# Patient Record
Sex: Male | Born: 1970
Health system: Southern US, Community
[De-identification: ages and names within clinical notes are randomized; demographics above are authoritative.]

## PROBLEM LIST (undated history)

## (undated) ENCOUNTER — Emergency Department (HOSPITAL_BASED_OUTPATIENT_CLINIC_OR_DEPARTMENT_OTHER): Admission: EM | Payer: No Typology Code available for payment source | Source: Home / Self Care

## (undated) ENCOUNTER — Emergency Department (HOSPITAL_BASED_OUTPATIENT_CLINIC_OR_DEPARTMENT_OTHER): Admission: EM | Payer: BC Managed Care – PPO | Source: Home / Self Care

## (undated) DIAGNOSIS — F32A Depression, unspecified: Secondary | ICD-10-CM

## (undated) DIAGNOSIS — G43909 Migraine, unspecified, not intractable, without status migrainosus: Secondary | ICD-10-CM

## (undated) DIAGNOSIS — F329 Major depressive disorder, single episode, unspecified: Secondary | ICD-10-CM

## (undated) DIAGNOSIS — G894 Chronic pain syndrome: Secondary | ICD-10-CM

## (undated) DIAGNOSIS — M419 Scoliosis, unspecified: Secondary | ICD-10-CM

## (undated) DIAGNOSIS — J189 Pneumonia, unspecified organism: Secondary | ICD-10-CM

## (undated) DIAGNOSIS — F419 Anxiety disorder, unspecified: Secondary | ICD-10-CM

## (undated) DIAGNOSIS — K219 Gastro-esophageal reflux disease without esophagitis: Secondary | ICD-10-CM

## (undated) DIAGNOSIS — IMO0001 Reserved for inherently not codable concepts without codable children: Secondary | ICD-10-CM

## (undated) DIAGNOSIS — M25579 Pain in unspecified ankle and joints of unspecified foot: Secondary | ICD-10-CM

## (undated) HISTORY — DX: Major depressive disorder, single episode, unspecified: F32.9

## (undated) HISTORY — PX: FOOT SURGERY: SHX648

## (undated) HISTORY — DX: Depression, unspecified: F32.A

## (undated) HISTORY — PX: ARTHROSCOPIC REPAIR ACL: SUR80

## (undated) HISTORY — DX: Pain in unspecified ankle and joints of unspecified foot: M25.579

## (undated) HISTORY — PX: ANKLE SURGERY: SHX546

---

## 2007-03-24 ENCOUNTER — Emergency Department (HOSPITAL_COMMUNITY): Admission: EM | Admit: 2007-03-24 | Discharge: 2007-03-24 | Payer: Self-pay | Admitting: Emergency Medicine

## 2008-06-04 ENCOUNTER — Emergency Department (HOSPITAL_BASED_OUTPATIENT_CLINIC_OR_DEPARTMENT_OTHER): Admission: EM | Admit: 2008-06-04 | Discharge: 2008-06-04 | Payer: Self-pay | Admitting: Emergency Medicine

## 2008-11-26 ENCOUNTER — Emergency Department (HOSPITAL_BASED_OUTPATIENT_CLINIC_OR_DEPARTMENT_OTHER): Admission: EM | Admit: 2008-11-26 | Discharge: 2008-11-26 | Payer: Self-pay | Admitting: Emergency Medicine

## 2008-11-26 ENCOUNTER — Ambulatory Visit: Payer: Self-pay | Admitting: Diagnostic Radiology

## 2008-12-13 ENCOUNTER — Emergency Department (HOSPITAL_BASED_OUTPATIENT_CLINIC_OR_DEPARTMENT_OTHER): Admission: EM | Admit: 2008-12-13 | Discharge: 2008-12-13 | Payer: Self-pay | Admitting: Emergency Medicine

## 2008-12-13 ENCOUNTER — Ambulatory Visit: Payer: Self-pay | Admitting: Diagnostic Radiology

## 2009-04-05 ENCOUNTER — Ambulatory Visit: Payer: Self-pay | Admitting: Diagnostic Radiology

## 2009-04-05 ENCOUNTER — Observation Stay (HOSPITAL_COMMUNITY): Admission: EM | Admit: 2009-04-05 | Discharge: 2009-04-06 | Payer: Self-pay | Admitting: Internal Medicine

## 2009-04-05 ENCOUNTER — Encounter: Payer: Self-pay | Admitting: Emergency Medicine

## 2009-05-13 ENCOUNTER — Emergency Department (HOSPITAL_BASED_OUTPATIENT_CLINIC_OR_DEPARTMENT_OTHER): Admission: EM | Admit: 2009-05-13 | Discharge: 2009-05-13 | Payer: Self-pay | Admitting: Emergency Medicine

## 2009-05-13 ENCOUNTER — Ambulatory Visit: Payer: Self-pay | Admitting: Diagnostic Radiology

## 2009-05-30 ENCOUNTER — Ambulatory Visit: Payer: Self-pay | Admitting: Diagnostic Radiology

## 2009-05-30 ENCOUNTER — Emergency Department (HOSPITAL_BASED_OUTPATIENT_CLINIC_OR_DEPARTMENT_OTHER): Admission: EM | Admit: 2009-05-30 | Discharge: 2009-05-30 | Payer: Self-pay | Admitting: Emergency Medicine

## 2009-08-12 ENCOUNTER — Ambulatory Visit: Payer: Self-pay | Admitting: Diagnostic Radiology

## 2009-08-12 ENCOUNTER — Emergency Department (HOSPITAL_BASED_OUTPATIENT_CLINIC_OR_DEPARTMENT_OTHER): Admission: EM | Admit: 2009-08-12 | Discharge: 2009-08-12 | Payer: Self-pay | Admitting: Emergency Medicine

## 2009-09-23 ENCOUNTER — Emergency Department (HOSPITAL_BASED_OUTPATIENT_CLINIC_OR_DEPARTMENT_OTHER): Admission: EM | Admit: 2009-09-23 | Discharge: 2009-09-23 | Payer: Self-pay | Admitting: Emergency Medicine

## 2009-12-30 ENCOUNTER — Emergency Department (HOSPITAL_BASED_OUTPATIENT_CLINIC_OR_DEPARTMENT_OTHER): Admission: EM | Admit: 2009-12-30 | Discharge: 2009-12-30 | Payer: Self-pay | Admitting: Emergency Medicine

## 2009-12-30 ENCOUNTER — Ambulatory Visit: Payer: Self-pay | Admitting: Diagnostic Radiology

## 2010-05-15 LAB — CBC
HCT: 37.2 % — ABNORMAL LOW (ref 39.0–52.0)
Hemoglobin: 13.2 g/dL (ref 13.0–17.0)
MCH: 31.8 pg (ref 26.0–34.0)
MCHC: 35.4 g/dL (ref 30.0–36.0)
RDW: 12.4 % (ref 11.5–15.5)

## 2010-05-15 LAB — DIFFERENTIAL
Lymphocytes Relative: 39 % (ref 12–46)
Lymphs Abs: 3.1 10*3/uL (ref 0.7–4.0)
Neutro Abs: 4.1 10*3/uL (ref 1.7–7.7)
Neutrophils Relative %: 50 % (ref 43–77)

## 2010-05-15 LAB — POCT CARDIAC MARKERS
CKMB, poc: 1.3 ng/mL (ref 1.0–8.0)
Myoglobin, poc: 51.4 ng/mL (ref 12–200)
Troponin i, poc: <0.05 ng/mL (ref 0.00–0.09)

## 2010-05-15 LAB — COMPREHENSIVE METABOLIC PANEL
CO2: 27 mEq/L (ref 19–32)
Calcium: 10 mg/dL (ref 8.4–10.5)
Creatinine, Ser: 0.9 mg/dL (ref 0.4–1.5)
GFR calc non Af Amer: >60 mL/min (ref >60)
Glucose, Bld: 121 mg/dL — ABNORMAL HIGH (ref 70–99)
Total Protein: 7.7 g/dL (ref 6.0–8.3)

## 2010-05-22 LAB — BASIC METABOLIC PANEL
BUN: 8 mg/dL (ref 6–23)
CO2: 28 mEq/L (ref 19–32)
Chloride: 105 mEq/L (ref 96–112)
Creatinine, Ser: 0.8 mg/dL (ref 0.4–1.5)
GFR calc Af Amer: >60 mL/min (ref >60)
Glucose, Bld: 84 mg/dL (ref 70–99)

## 2010-05-22 LAB — POCT CARDIAC MARKERS
Myoglobin, poc: 95.9 ng/mL (ref 12–200)
Troponin i, poc: <0.05 ng/mL (ref 0.00–0.09)

## 2010-05-22 LAB — CBC
MCHC: 34.4 g/dL (ref 30.0–36.0)
MCV: 93.3 fL (ref 78.0–100.0)
RBC: 3.89 MIL/uL — ABNORMAL LOW (ref 4.22–5.81)

## 2010-05-22 LAB — DIFFERENTIAL
Basophils Relative: 1 % (ref 0–1)
Eosinophils Absolute: 0.1 10*3/uL (ref 0.0–0.7)
Monocytes Absolute: 1 10*3/uL (ref 0.1–1.0)
Monocytes Relative: 11 % (ref 3–12)
Neutrophils Relative %: 48 % (ref 43–77)

## 2010-05-24 LAB — BASIC METABOLIC PANEL
CO2: 26 mEq/L (ref 19–32)
Glucose, Bld: 106 mg/dL — ABNORMAL HIGH (ref 70–99)
Potassium: 4 mEq/L (ref 3.5–5.1)
Sodium: 138 mEq/L (ref 135–145)

## 2010-05-24 LAB — CK TOTAL AND CKMB (NOT AT ARMC)
Relative Index: 0.5 (ref 0.0–2.5)
Total CK: 258 U/L — ABNORMAL HIGH (ref 7–232)
Total CK: 313 U/L — ABNORMAL HIGH (ref 7–232)

## 2010-05-24 LAB — LIPID PANEL
Cholesterol: 155 mg/dL (ref 0–200)
LDL Cholesterol: 87 mg/dL (ref 0–99)
Total CHOL/HDL Ratio: 4 RATIO

## 2010-05-24 LAB — HEMOGLOBIN A1C
Hgb A1c MFr Bld: 5.9 % (ref 4.6–6.1)
Mean Plasma Glucose: 123 mg/dL

## 2010-05-24 LAB — TSH: TSH: 1.045 u[IU]/mL (ref 0.350–4.500)

## 2010-05-24 LAB — HEPATIC FUNCTION PANEL: Total Protein: 6.7 g/dL (ref 6.0–8.3)

## 2010-05-24 LAB — CBC
MCV: 92.9 fL (ref 78.0–100.0)
RBC: 4.03 MIL/uL — ABNORMAL LOW (ref 4.22–5.81)
WBC: 6.5 10*3/uL (ref 4.0–10.5)

## 2010-05-26 ENCOUNTER — Emergency Department (HOSPITAL_BASED_OUTPATIENT_CLINIC_OR_DEPARTMENT_OTHER)
Admission: EM | Admit: 2010-05-26 | Discharge: 2010-05-26 | Disposition: A | Discharge status: Home / Self Care | Payer: Medicaid Other | Attending: Emergency Medicine | Admitting: Emergency Medicine

## 2010-05-26 DIAGNOSIS — M25579 Pain in unspecified ankle and joints of unspecified foot: Secondary | ICD-10-CM | POA: Insufficient documentation

## 2010-05-26 DIAGNOSIS — E7800 Pure hypercholesterolemia, unspecified: Secondary | ICD-10-CM | POA: Insufficient documentation

## 2010-06-06 LAB — POCT CARDIAC MARKERS
CKMB, poc: <1 ng/mL — ABNORMAL LOW (ref 1.0–8.0)
CKMB, poc: <1 ng/mL — ABNORMAL LOW (ref 1.0–8.0)
Myoglobin, poc: 61.4 ng/mL (ref 12–200)
Myoglobin, poc: 70.2 ng/mL (ref 12–200)
Troponin i, poc: <0.05 ng/mL (ref 0.00–0.09)
Troponin i, poc: <0.05 ng/mL (ref 0.00–0.09)

## 2010-06-06 LAB — CBC
HCT: 38.7 % — ABNORMAL LOW (ref 39.0–52.0)
MCHC: 35.2 g/dL (ref 30.0–36.0)
MCV: 91.2 fL (ref 78.0–100.0)
Platelets: 309 10*3/uL (ref 150–400)
RDW: 12.2 % (ref 11.5–15.5)

## 2010-06-06 LAB — BASIC METABOLIC PANEL
BUN: 11 mg/dL (ref 6–23)
CO2: 28 mEq/L (ref 19–32)
Chloride: 104 mEq/L (ref 96–112)
Glucose, Bld: 140 mg/dL — ABNORMAL HIGH (ref 70–99)
Potassium: 3.6 mEq/L (ref 3.5–5.1)

## 2010-06-07 LAB — BASIC METABOLIC PANEL
Calcium: 9.5 mg/dL (ref 8.4–10.5)
GFR calc non Af Amer: >60 mL/min (ref >60)
Potassium: 4 mEq/L (ref 3.5–5.1)
Sodium: 142 mEq/L (ref 135–145)

## 2010-06-07 LAB — CBC
HCT: 37.7 % — ABNORMAL LOW (ref 39.0–52.0)
Hemoglobin: 12.8 g/dL — ABNORMAL LOW (ref 13.0–17.0)
MCHC: 33.9 g/dL (ref 30.0–36.0)
MCV: 91.7 fL (ref 78.0–100.0)
Platelets: 287 10*3/uL (ref 150–400)
RBC: 4.11 MIL/uL — ABNORMAL LOW (ref 4.22–5.81)
RDW: 12.4 % (ref 11.5–15.5)
WBC: 7.9 10*3/uL (ref 4.0–10.5)

## 2010-06-07 LAB — POCT CARDIAC MARKERS
CKMB, poc: 1.7 ng/mL (ref 1.0–8.0)
Myoglobin, poc: 67 ng/mL (ref 12–200)
Troponin i, poc: <0.05 ng/mL (ref 0.00–0.09)

## 2010-07-17 ENCOUNTER — Emergency Department (HOSPITAL_BASED_OUTPATIENT_CLINIC_OR_DEPARTMENT_OTHER)
Admission: EM | Admit: 2010-07-17 | Discharge: 2010-07-17 | Disposition: A | Discharge status: Home / Self Care | Payer: Medicaid Other | Attending: Emergency Medicine | Admitting: Emergency Medicine

## 2010-07-17 DIAGNOSIS — G8929 Other chronic pain: Secondary | ICD-10-CM | POA: Insufficient documentation

## 2010-07-17 DIAGNOSIS — M25579 Pain in unspecified ankle and joints of unspecified foot: Secondary | ICD-10-CM | POA: Insufficient documentation

## 2010-07-17 DIAGNOSIS — E7800 Pure hypercholesterolemia, unspecified: Secondary | ICD-10-CM | POA: Insufficient documentation

## 2010-08-04 ENCOUNTER — Emergency Department (HOSPITAL_BASED_OUTPATIENT_CLINIC_OR_DEPARTMENT_OTHER)
Admission: EM | Admit: 2010-08-04 | Discharge: 2010-08-04 | Discharge status: Against Medical Advice | Payer: Medicaid Other | Attending: Emergency Medicine | Admitting: Emergency Medicine

## 2010-10-28 ENCOUNTER — Emergency Department (INDEPENDENT_AMBULATORY_CARE_PROVIDER_SITE_OTHER): Payer: Medicaid Other

## 2010-10-28 ENCOUNTER — Encounter: Payer: Self-pay | Admitting: *Deleted

## 2010-10-28 ENCOUNTER — Emergency Department (HOSPITAL_BASED_OUTPATIENT_CLINIC_OR_DEPARTMENT_OTHER)
Admission: EM | Admit: 2010-10-28 | Discharge: 2010-10-28 | Disposition: A | Discharge status: Home / Self Care | Payer: Medicaid Other | Attending: Emergency Medicine | Admitting: Emergency Medicine

## 2010-10-28 DIAGNOSIS — S93699A Other sprain of unspecified foot, initial encounter: Secondary | ICD-10-CM | POA: Insufficient documentation

## 2010-10-28 DIAGNOSIS — X58XXXA Exposure to other specified factors, initial encounter: Secondary | ICD-10-CM | POA: Insufficient documentation

## 2010-10-28 DIAGNOSIS — IMO0002 Reserved for concepts with insufficient information to code with codable children: Secondary | ICD-10-CM

## 2010-10-28 DIAGNOSIS — M79609 Pain in unspecified limb: Secondary | ICD-10-CM

## 2010-10-28 DIAGNOSIS — R609 Edema, unspecified: Secondary | ICD-10-CM

## 2010-10-28 DIAGNOSIS — S93409A Sprain of unspecified ligament of unspecified ankle, initial encounter: Secondary | ICD-10-CM

## 2010-10-28 MED ORDER — IBUPROFEN 800 MG PO TABS
ORAL_TABLET | ORAL | Status: AC
  Filled 2010-10-28: qty 1

## 2010-10-28 MED ORDER — IBUPROFEN 800 MG PO TABS: 800.0000 mg | ORAL_TABLET | Freq: Three times a day (TID) | ORAL | Status: DC

## 2010-10-28 MED ORDER — OXYCODONE-ACETAMINOPHEN 5-325 MG PO TABS: 2.0000 | ORAL_TABLET | ORAL | Status: AC | PRN

## 2010-10-28 MED ORDER — IBUPROFEN 800 MG PO TABS: 800.0000 mg | ORAL_TABLET | Freq: Three times a day (TID) | ORAL | Status: AC

## 2010-10-28 MED ORDER — OXYCODONE-ACETAMINOPHEN 5-325 MG PO TABS
1.0000 | ORAL_TABLET | Freq: Once | ORAL | Status: AC
  Administered 2010-10-28: 1 via ORAL
  Filled 2010-10-28: qty 1

## 2010-10-28 MED ORDER — IBUPROFEN 800 MG PO TABS
800.0000 mg | ORAL_TABLET | Freq: Once | ORAL | Status: AC
  Administered 2010-10-28: 800 mg via ORAL

## 2010-10-28 NOTE — ED Provider Notes (Signed)
History     CSN: [id number] Arrival date & time: 10/28/2010  1:09 AM  Chief Complaint  Patient presents with  . Foot Injury   HPI 40 year old male presents to emergency room with complaint of left foot pain. 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 Patient is status post first metatarsal joint reconstructive surgery in May. Patient was seen this past week by his surgeon from [facility], and patient was doing well postop but may need to have surgery for a bunion. Patient has been walking with a cane since the injury. He reports pain is currently excruciating and has been steadily worsening since onset. No weakness numbness. Patient is concerned he has messed up his surgery. No other injuries  History reviewed. No pertinent past medical history.  Past Surgical History  Procedure Date  . Ankle surgery   . Foot surgery     History reviewed. No pertinent family history.  History  Substance Use Topics  . Smoking status: Never Smoker   . Smokeless tobacco: Not on file  . Alcohol Use: No      Review of Systems  Constitutional: Negative.   HENT: Negative.   Eyes: Negative.   Respiratory: Negative.   Cardiovascular: Negative.   Gastrointestinal: Negative.   Genitourinary: Negative.   Musculoskeletal: Positive for arthralgias.  Neurological: Negative.   Hematological: Negative.     Physical Exam  BP 110/69  Pulse 60  Temp(Src) 99 F (37.2 C) (Oral)  Resp 18  Ht 5\' 7"  (1.702 m)  Wt 204 lb (92.534 kg)  BMI 31.95 kg/m2  SpO2 97%  Physical Exam  Constitutional: He is oriented to person, place, and time. He appears well-developed and well-nourished.       Patient is uncomfortable appearing  HENT:  Head: Normocephalic and atraumatic.  Eyes: Conjunctivae and EOM are normal. Pupils are equal, round,  and reactive to light.       Patient with horizontal nystagmus noted  Musculoskeletal: He exhibits tenderness. He exhibits no edema.       Musculoskeletal exam normal other than findings in left lower extremity.  LLE: Patient noted to have surgical scar to the first mid metatarsal. No obvious deformity. No erythema, fluctuance or crepitus. Patient reports pain with palpation of first metatarsal, also along plantar aspect of medial left foot, patient also with pain along his Achilles and posterior medial malleolus. Achilles tendon is intact normal range of motion at ankle and toes. Neurovascularly intact normal sensation, normal pulses, good cap refill  Neurological: He is alert and oriented to person, place, and time.  Skin: Skin is warm and dry. No rash noted. No erythema. No pallor.    ED Course  Procedures  Dg Foot Complete Left  10/28/2010  *RADIOLOGY REPORT*  Clinical Data: Left foot pain and swelling in the region of the first metatarsal.  LEFT FOOT - COMPLETE 3+ VIEW  Comparison: None.  Findings: There is 2 screw fixation across the first tarsometatarsal joint.  Associated heterotopic ossification across the joint without complete fusion.  No definite acute fracture is identified.  No dislocation.  The Lisfranc joint appears maintained.  IMPRESSION: Postsurgical changes across the first tarsometatarsal joint without definite acute fracture identified.If clinical concern for a fracture persists, recommend a repeat radiograph in 3-5 days to evaluate for interval change or callus  formation.  Original Report Authenticated By: Waneta Martins, M.D.    MDM X-rays without fracture or distortion of prior surgical screws. Will place patient in air cast, and encourage him to use his crutches and avoid weightbearing on that foot. Patient is to follow up with his foot surgeon within the week. I will prescribe short course of pain medicine for acute injury      Olivia Mackie, MD 10/28/10 [phone]

## 2010-10-28 NOTE — ED Notes (Signed)
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 Now c/o left foot pain. Hx surgery to same in May of this year.

## 2010-10-28 NOTE — ED Notes (Signed)
Pt transported to xray via wheel chair

## 2011-03-06 ENCOUNTER — Emergency Department (HOSPITAL_BASED_OUTPATIENT_CLINIC_OR_DEPARTMENT_OTHER)
Admission: EM | Admit: 2011-03-06 | Discharge: 2011-03-06 | Disposition: A | Discharge status: Home / Self Care | Payer: Self-pay | Attending: Emergency Medicine | Admitting: Emergency Medicine

## 2011-03-06 ENCOUNTER — Encounter (HOSPITAL_BASED_OUTPATIENT_CLINIC_OR_DEPARTMENT_OTHER): Payer: Self-pay | Admitting: Emergency Medicine

## 2011-03-06 DIAGNOSIS — M79673 Pain in unspecified foot: Secondary | ICD-10-CM

## 2011-03-06 DIAGNOSIS — M25579 Pain in unspecified ankle and joints of unspecified foot: Secondary | ICD-10-CM | POA: Insufficient documentation

## 2011-03-06 MED ORDER — OXYCODONE-ACETAMINOPHEN 5-325 MG PO TABS: 1.0000 | ORAL_TABLET | Freq: Four times a day (QID) | ORAL | Status: AC | PRN

## 2011-03-06 MED ORDER — OXYCODONE-ACETAMINOPHEN 5-325 MG PO TABS
1.0000 | ORAL_TABLET | Freq: Once | ORAL | Status: AC
  Administered 2011-03-06: 1 via ORAL
  Filled 2011-03-06: qty 1

## 2011-03-06 NOTE — ED Provider Notes (Addendum)
History     CSN: [id number]  Arrival date & time 03/06/11  0100   None     Chief Complaint  Patient presents with  . Foot Pain    (Consider location/radiation/quality/duration/timing/severity/associated sxs/prior treatment) Patient is a 41 y.o. male presenting with lower extremity pain. The history is provided by the patient.  Foot Pain  pt c/o left foot pain. States has had 3 surgeries on foot, last of which was 10/12. States had healed well, but felt 'overdid' it 2 nights ago. C/o pain to mid foot. No injury, twisting or fall. No redness/swelling. No numbness. Constant dull pain, worse w palpation. Has been ambulatory.   History reviewed. No pertinent past medical history.  Past Surgical History  Procedure Date  . Ankle surgery   . Foot surgery     No family history on file.  History  Substance Use Topics  . Smoking status: Never Smoker   . Smokeless tobacco: Not on file  . Alcohol Use: No      Review of Systems  Constitutional: Negative for fever.  Skin: Negative for wound.  Neurological: Negative for numbness.    Allergies  Review of patient's allergies indicates no known allergies.  Home Medications  No current outpatient prescriptions on file.  BP 139/81  Pulse 77  Temp(Src) 98.9 F (37.2 C) (Oral)  Resp 18  SpO2 98%  Physical Exam  Nursing note and vitals reviewed. Constitutional: He is oriented to person, place, and time. He appears well-developed and well-nourished. No distress.  HENT:  Head: Atraumatic.  Eyes: Pupils are equal, round, and reactive to light.  Neck: Neck supple. No tracheal deviation present.  Cardiovascular: Normal rate.   Pulmonary/Chest: Effort normal. No accessory muscle usage. No respiratory distress.  Musculoskeletal: Normal range of motion.       Healed surgical scar to dorsum left foot without sign of infection. Skin intact. No sts or focal bony tenderness. Mild diffuse tenderness to midfoot. Distal pulses palp. No  erythema, or inc warmth to foot.   Neurological: He is alert and oriented to person, place, and time.  Skin: Skin is warm and dry.  Psychiatric: He has a normal mood and affect.    ED Course  Procedures (including critical care time)     MDM  Pt states inadequate pain relief w motrin. Will give small quantity pain rx, encouraged follow up with his doctors. Pt has ride, does not have to drive.         Suzi Roots, MD 03/06/11 1308  Suzi Roots, MD 03/07/11 [phone]

## 2011-03-06 NOTE — ED Notes (Signed)
Pt c/o left foot pain. Pt reports having bunion and reconstructive surgery on left foot 10/27. No new injury known

## 2011-04-07 ENCOUNTER — Other Ambulatory Visit: Payer: Self-pay

## 2011-04-07 ENCOUNTER — Encounter (HOSPITAL_BASED_OUTPATIENT_CLINIC_OR_DEPARTMENT_OTHER): Payer: Self-pay | Admitting: *Deleted

## 2011-04-07 DIAGNOSIS — F341 Dysthymic disorder: Secondary | ICD-10-CM | POA: Insufficient documentation

## 2011-04-07 DIAGNOSIS — F410 Panic disorder [episodic paroxysmal anxiety] without agoraphobia: Secondary | ICD-10-CM | POA: Insufficient documentation

## 2011-04-07 NOTE — ED Notes (Signed)
Pt c/o increased stress and anxiety for few days, now c/o numbness in fingertips and toes, heart racing, and shortness of breath. Pt states that he feels like he is having a anxiety attack.

## 2011-04-08 ENCOUNTER — Emergency Department (HOSPITAL_BASED_OUTPATIENT_CLINIC_OR_DEPARTMENT_OTHER)
Admission: EM | Admit: 2011-04-08 | Discharge: 2011-04-08 | Disposition: A | Discharge status: Home / Self Care | Payer: Medicaid Other | Attending: Emergency Medicine | Admitting: Emergency Medicine

## 2011-04-08 DIAGNOSIS — F410 Panic disorder [episodic paroxysmal anxiety] without agoraphobia: Secondary | ICD-10-CM

## 2011-04-08 HISTORY — DX: Anxiety disorder, unspecified: F41.9

## 2011-04-08 HISTORY — DX: Depression, unspecified: F32.A

## 2011-04-08 HISTORY — DX: Major depressive disorder, single episode, unspecified: F32.9

## 2011-04-08 LAB — BASIC METABOLIC PANEL
BUN: 12 mg/dL (ref 6–23)
Calcium: 9.1 mg/dL (ref 8.4–10.5)
GFR calc Af Amer: >90 mL/min (ref >90)
GFR calc non Af Amer: >90 mL/min (ref >90)
Potassium: 3.7 mEq/L (ref 3.5–5.1)
Sodium: 138 mEq/L (ref 135–145)

## 2011-04-08 LAB — CBC
Hemoglobin: 11.7 g/dL — ABNORMAL LOW (ref 13.0–17.0)
MCH: 30.2 pg (ref 26.0–34.0)
MCHC: 34.6 g/dL (ref 30.0–36.0)
Platelets: 281 10*3/uL (ref 150–400)

## 2011-04-08 LAB — DIFFERENTIAL
Basophils Absolute: 0 10*3/uL (ref 0.0–0.1)
Basophils Relative: 0 % (ref 0–1)
Eosinophils Absolute: 0.1 10*3/uL (ref 0.0–0.7)
Monocytes Relative: 10 % (ref 3–12)
Neutro Abs: 3.6 10*3/uL (ref 1.7–7.7)
Neutrophils Relative %: 46 % (ref 43–77)

## 2011-04-08 MED ORDER — SODIUM CHLORIDE 0.9 % IV SOLN
Freq: Once | INTRAVENOUS | Status: AC
  Administered 2011-04-08: 02:00:00 via INTRAVENOUS

## 2011-04-08 MED ORDER — LORAZEPAM 2 MG/ML IJ SOLN
1.0000 mg | Freq: Once | INTRAMUSCULAR | Status: AC
  Administered 2011-04-08: 1 mg via INTRAVENOUS
  Filled 2011-04-08: qty 1

## 2011-04-08 NOTE — ED Notes (Signed)
Pt reports of continue to feel numbness with tingling in upper and lower ext

## 2011-04-08 NOTE — ED Provider Notes (Signed)
History     CSN: [id number]  Arrival date & time 04/07/11  2317   First MD Initiated Contact with Patient 04/08/11 0153      Chief Complaint  Patient presents with  . Panic Attack    (Consider location/radiation/quality/duration/timing/severity/associated sxs/prior treatment) HPI This is a 41 year old male complains the onset of chest tightness at 9:30 PM yesterday. The symptoms are mild to moderate to severe at times. The discomfort has been constant but varies as noted. He is also having paresthesias in his arms and legs and numbness in his fingertips and toes. He is not having perioral paresthesias. He had dyspnea earlier but not at present time. He has had intermittent nausea. He had a similar episode 3 weeks ago and was diagnosed as having a panic attack. He was placed on antidepressant as well as lorazepam. He was not at home and so did not have any lorazepam to take this evening.  Past Medical History  Diagnosis Date  . Anxiety and depression     Past Surgical History  Procedure Date  . Ankle surgery   . Foot surgery     History reviewed. No pertinent family history.  History  Substance Use Topics  . Smoking status: Never Smoker   . Smokeless tobacco: Not on file  . Alcohol Use: No      Review of Systems  All other systems reviewed and are negative.    Allergies  Review of patient's allergies indicates no known allergies.  Home Medications   Current Outpatient Rx  Name Route Sig Dispense Refill  . LORAZEPAM 1 MG PO TABS Oral Take 1 mg by mouth every 8 (eight) hours.    . OXYCODONE HCL ER 15 MG PO TB12 Oral Take 15 mg by mouth every 12 (twelve) hours.      BP 147/78  Pulse 91  Temp(Src) 98 F (36.7 C) (Oral)  Resp 19  Ht 5\' 7"  (1.702 m)  Wt 200 lb (90.719 kg)  BMI 31.32 kg/m2  SpO2 97%  Physical Exam General: Well-developed, well-nourished male in no acute distress; appearance consistent with age of record; comfortable-appearing HENT:  normocephalic, atraumatic Eyes: pupils equal round and reactive to light; extraocular muscles intact Neck: supple Heart: regular rate and rhythm Lungs: clear to auscultation bilaterally Abdomen: soft; nondistended; nontender; no masses or hepatosplenomegaly; bowel sounds present Extremities: No deformity; full range of motion; pulses normal Neurologic: Awake, alert and oriented; motor function intact in all extremities and symmetric; no facial droop Skin: Warm and dry Psychiatric: Flat affect    ED Course  Procedures (including critical care time)     MDM  EKG Interpretation:  Date & Time: 04/08/2011 1:54 AM  Rate: 67  Rhythm: normal sinus rhythm  QRS Axis: normal  Intervals: normal  ST/T Wave abnormalities: Inverted T waves inferiorly and laterally  Conduction Disutrbances:none  Narrative Interpretation:   Old EKG Reviewed: unchanged  Nursing notes and vitals signs, including pulse oximetry, reviewed.  Summary of this visit's results, reviewed by myself:  Labs:  Results for orders placed during the hospital encounter of 04/08/11  TROPONIN I      Component Value Range   Troponin I <0.30  <0.30 (ng/mL)  BASIC METABOLIC PANEL      Component Value Range   Sodium 138  135 - 145 (mEq/L)   Potassium 3.7  3.5 - 5.1 (mEq/L)   Chloride 105  96 - 112 (mEq/L)   CO2 26  19 - 32 (mEq/L)   Glucose, Bld  118 (*) 70 - 99 (mg/dL)   BUN 12  6 - 23 (mg/dL)   Creatinine, Ser 7.82  0.50 - 1.35 (mg/dL)   Calcium 9.1  8.4 - 95.6 (mg/dL)   GFR calc non Af Amer >90  >90 (mL/min)   GFR calc Af Amer >90  >90 (mL/min)  CBC      Component Value Range   WBC 7.9  4.0 - 10.5 (K/uL)   RBC 3.87 (*) 4.22 - 5.81 (MIL/uL)   Hemoglobin 11.7 (*) 13.0 - 17.0 (g/dL)   HCT 21.3 (*) 08.6 - 52.0 (%)   MCV 87.3  78.0 - 100.0 (fL)   MCH 30.2  26.0 - 34.0 (pg)   MCHC 34.6  30.0 - 36.0 (g/dL)   RDW 57.8  46.9 - 62.9 (%)   Platelets 281  150 - 400 (K/uL)  DIFFERENTIAL      Component Value Range    Neutrophils Relative 46  43 - 77 (%)   Neutro Abs 3.6  1.7 - 7.7 (K/uL)   Lymphocytes Relative 42  12 - 46 (%)   Lymphs Abs 3.3  0.7 - 4.0 (K/uL)   Monocytes Relative 10  3 - 12 (%)   Monocytes Absolute 0.8  0.1 - 1.0 (K/uL)   Eosinophils Relative 2  0 - 5 (%)   Eosinophils Absolute 0.1  0.0 - 0.7 (K/uL)   Basophils Relative 0  0 - 1 (%)   Basophils Absolute 0.0  0.0 - 0.1 (K/uL)   3:22 AM Asymptomatic after IV Ativan. Patient will follow up with Dr. Julio Sicks. He was advised to return for worsening symptoms.             Hanley Seamen, MD 04/08/11 [phone]

## 2011-06-03 ENCOUNTER — Emergency Department (HOSPITAL_BASED_OUTPATIENT_CLINIC_OR_DEPARTMENT_OTHER)
Admission: EM | Admit: 2011-06-03 | Discharge: 2011-06-04 | Disposition: A | Discharge status: Home / Self Care | Payer: Medicaid Other | Attending: Emergency Medicine | Admitting: Emergency Medicine

## 2011-06-03 ENCOUNTER — Encounter (HOSPITAL_BASED_OUTPATIENT_CLINIC_OR_DEPARTMENT_OTHER): Payer: Self-pay | Admitting: *Deleted

## 2011-06-03 DIAGNOSIS — F3289 Other specified depressive episodes: Secondary | ICD-10-CM | POA: Insufficient documentation

## 2011-06-03 DIAGNOSIS — F411 Generalized anxiety disorder: Secondary | ICD-10-CM | POA: Insufficient documentation

## 2011-06-03 DIAGNOSIS — G8918 Other acute postprocedural pain: Secondary | ICD-10-CM

## 2011-06-03 DIAGNOSIS — Z79899 Other long term (current) drug therapy: Secondary | ICD-10-CM | POA: Insufficient documentation

## 2011-06-03 DIAGNOSIS — F329 Major depressive disorder, single episode, unspecified: Secondary | ICD-10-CM | POA: Insufficient documentation

## 2011-06-03 MED ORDER — HYDROMORPHONE HCL PF 2 MG/ML IJ SOLN
2.0000 mg | Freq: Once | INTRAMUSCULAR | Status: AC
  Administered 2011-06-04: 2 mg via INTRAMUSCULAR
  Filled 2011-06-03: qty 1

## 2011-06-03 NOTE — ED Notes (Signed)
Pt. Reports he has relafin and oxycodone for pain and he reports it is not working.  Pt. Reports he did not call his Dr. About the pain.

## 2011-06-03 NOTE — ED Notes (Signed)
Surgery 06/02/2011 by dr Janett Billow shared at [facility] , implant placed to keep foot from rotating pt states pain in unbearable, oxycodone not helping pain, cap refil <3 sec, able to move toes

## 2011-06-03 NOTE — ED Notes (Signed)
Pt complaining of pain to foot, last time patient took pain medication was at 2130, he took percocet that was prescribed by surgeon. Pt requesting additional medication

## 2011-06-03 NOTE — Discharge Instructions (Signed)
Elevate your foot. Use ice packs for pain and swelling. Let Dr Raynald Kemp know about your ED visit in the morning.

## 2011-06-03 NOTE — ED Notes (Signed)
Dr.Knapp at bedside  

## 2011-06-04 NOTE — ED Provider Notes (Signed)
History     CSN: [id number]  Arrival date & time 06/03/11  2209   First MD Initiated Contact with Patient 06/03/11 2256      Chief Complaint  Patient presents with  . Foot Pain    Pt. reports he had R foot surgery on yesterday at [facility].  Dr. Raynald Kemp performed the surgery per Pt.    (Consider location/radiation/quality/duration/timing/severity/associated sxs/prior treatment) HPI  Patient relates he has very flat feet and he's had 3 surgeries on each one of his feet. His most recent surgery was yesterday and he had implant inserted on his right foot. This was done by Dr. Raynald Kemp at the [facility]. He relates he was doing fine but tonight his pain got more intense and his oral pain medicines are not helping. He denies any fever. He denies any injury since his surgery. He states the pain is in his whole foot.  PCP Dr. Julio Sicks Podiatrist dr Raynald Kemp  Past Medical History  Diagnosis Date  . Anxiety and depression   nystagymus  Past Surgical History  Procedure Date  . Ankle surgery   . Foot surgery     No family history on file.  History  Substance Use Topics  . Smoking status: Never Smoker   . Smokeless tobacco: Not on file  . Alcohol Use: No  disability   Review of Systems  All other systems reviewed and are negative.    Allergies  Review of patient's allergies indicates no known allergies.  Home Medications   Current Outpatient Rx  Name Route Sig Dispense Refill  . LORAZEPAM 1 MG PO TABS Oral Take 2-3 mg by mouth 2 (two) times daily between meals as needed. 2 during the day if needed and 3 at night for anxiety    . MIRTAZAPINE 15 MG PO TABS Oral Take 7.5 mg by mouth at bedtime.    Marland Kitchen NABUMETONE 500 MG PO TABS Oral Take 500 mg by mouth daily.    Marland Kitchen OVER THE COUNTER MEDICATION  [employer] 1 scoop daily    . OXYCODONE HCL 15 MG PO TABS Oral Take 15 mg by mouth every 4 (four) hours as needed. For pain      BP 111/85  Pulse 78   Temp 98.3 F (36.8 C)  Resp 18  Ht 5\' 7"  (1.702 m)  Wt 200 lb (90.719 kg)  BMI 31.32 kg/m2  SpO2 98%  Vital signs normal    Physical Exam  Constitutional: He is oriented to person, place, and time. He appears well-developed and well-nourished.  Non-toxic appearance. He does not appear ill. No distress.  HENT:  Head: Normocephalic and atraumatic.  Right Ear: External ear normal.  Left Ear: External ear normal.  Nose: Nose normal. No mucosal edema or rhinorrhea.  Mouth/Throat: Oropharynx is clear and moist and mucous membranes are normal. No dental abscesses or uvula swelling.  Eyes: Conjunctivae and EOM are normal. Pupils are equal, round, and reactive to light.       Patient noted to have horizontal nystagymus  Neck: Normal range of motion and full passive range of motion without pain. Neck supple.  Pulmonary/Chest: Effort normal. No respiratory distress. He has no rhonchi. He exhibits no crepitus.  Abdominal: Normal appearance.  Musculoskeletal: Normal range of motion. He exhibits edema. He exhibits no tenderness.       Patient has a small surgical incision on the lateral dorsal aspect of his right foot with mild swelling surrounding it. The dressing only had  a small dot of dried blood on it. He has intact pulses. His foot and toes are warm without change in color.  Neurological: He is alert and oriented to person, place, and time. He has normal strength. No cranial nerve deficit.  Skin: Skin is warm, dry and intact. No rash noted. No erythema. No pallor.  Psychiatric: He has a normal mood and affect. His speech is normal and behavior is normal. His mood appears not anxious.    ED Course  Procedures (including critical care time)   Medications  HYDROmorphone (DILAUDID) injection 2 mg (2 mg Intramuscular Given 06/04/11 0008)   Dressing reapplied and pt given an ice pack to use.      1. Post-operative pain     Plan discharge  Devoria Albe, MD, FACEP   MDM           Ward Givens, MD 06/04/11 [phone]

## 2011-10-12 ENCOUNTER — Encounter (HOSPITAL_BASED_OUTPATIENT_CLINIC_OR_DEPARTMENT_OTHER): Payer: Self-pay | Admitting: *Deleted

## 2011-10-12 ENCOUNTER — Emergency Department (HOSPITAL_BASED_OUTPATIENT_CLINIC_OR_DEPARTMENT_OTHER): Payer: Medicaid Other

## 2011-10-12 ENCOUNTER — Emergency Department (HOSPITAL_BASED_OUTPATIENT_CLINIC_OR_DEPARTMENT_OTHER)
Admission: EM | Admit: 2011-10-12 | Discharge: 2011-10-12 | Disposition: A | Discharge status: Home / Self Care | Payer: Medicaid Other | Attending: Emergency Medicine | Admitting: Emergency Medicine

## 2011-10-12 DIAGNOSIS — R0789 Other chest pain: Secondary | ICD-10-CM | POA: Insufficient documentation

## 2011-10-12 DIAGNOSIS — K219 Gastro-esophageal reflux disease without esophagitis: Secondary | ICD-10-CM | POA: Insufficient documentation

## 2011-10-12 DIAGNOSIS — T59811A Toxic effect of smoke, accidental (unintentional), initial encounter: Secondary | ICD-10-CM

## 2011-10-12 DIAGNOSIS — F411 Generalized anxiety disorder: Secondary | ICD-10-CM | POA: Insufficient documentation

## 2011-10-12 DIAGNOSIS — X001XXA Exposure to smoke in uncontrolled fire in building or structure, initial encounter: Secondary | ICD-10-CM | POA: Insufficient documentation

## 2011-10-12 DIAGNOSIS — F3289 Other specified depressive episodes: Secondary | ICD-10-CM | POA: Insufficient documentation

## 2011-10-12 DIAGNOSIS — Y92009 Unspecified place in unspecified non-institutional (private) residence as the place of occurrence of the external cause: Secondary | ICD-10-CM | POA: Insufficient documentation

## 2011-10-12 DIAGNOSIS — Z87891 Personal history of nicotine dependence: Secondary | ICD-10-CM | POA: Insufficient documentation

## 2011-10-12 DIAGNOSIS — F329 Major depressive disorder, single episode, unspecified: Secondary | ICD-10-CM | POA: Insufficient documentation

## 2011-10-12 DIAGNOSIS — R0602 Shortness of breath: Secondary | ICD-10-CM | POA: Insufficient documentation

## 2011-10-12 DIAGNOSIS — J705 Respiratory conditions due to smoke inhalation: Secondary | ICD-10-CM | POA: Insufficient documentation

## 2011-10-12 HISTORY — DX: Gastro-esophageal reflux disease without esophagitis: K21.9

## 2011-10-12 HISTORY — DX: Reserved for inherently not codable concepts without codable children: IMO0001

## 2011-10-12 MED ORDER — ALBUTEROL SULFATE HFA 108 (90 BASE) MCG/ACT IN AERS
2.0000 | INHALATION_SPRAY | Freq: Four times a day (QID) | RESPIRATORY_TRACT | Status: DC | PRN
  Administered 2011-10-12: 2 via RESPIRATORY_TRACT

## 2011-10-12 MED ORDER — OXYCODONE-ACETAMINOPHEN 5-325 MG PO TABS
1.0000 | ORAL_TABLET | Freq: Once | ORAL | Status: AC
  Administered 2011-10-12: 1 via ORAL
  Filled 2011-10-12 (×2): qty 1

## 2011-10-12 MED ORDER — ALBUTEROL SULFATE HFA 108 (90 BASE) MCG/ACT IN AERS
INHALATION_SPRAY | RESPIRATORY_TRACT | Status: AC
  Filled 2011-10-12: qty 6.7

## 2011-10-12 MED ORDER — ALBUTEROL SULFATE HFA 108 (90 BASE) MCG/ACT IN AERS: 2.0000 | INHALATION_SPRAY | RESPIRATORY_TRACT | Status: DC
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
## 2012-01-13 ENCOUNTER — Emergency Department (HOSPITAL_BASED_OUTPATIENT_CLINIC_OR_DEPARTMENT_OTHER)
Admission: EM | Admit: 2012-01-13 | Discharge: 2012-01-13 | Disposition: A | Discharge status: Home / Self Care | Payer: Medicaid Other | Attending: Emergency Medicine | Admitting: Emergency Medicine

## 2012-01-13 ENCOUNTER — Encounter (HOSPITAL_BASED_OUTPATIENT_CLINIC_OR_DEPARTMENT_OTHER): Payer: Self-pay | Admitting: *Deleted

## 2012-01-13 DIAGNOSIS — IMO0002 Reserved for concepts with insufficient information to code with codable children: Secondary | ICD-10-CM | POA: Insufficient documentation

## 2012-01-13 DIAGNOSIS — Y929 Unspecified place or not applicable: Secondary | ICD-10-CM | POA: Insufficient documentation

## 2012-01-13 DIAGNOSIS — Y9389 Activity, other specified: Secondary | ICD-10-CM | POA: Insufficient documentation

## 2012-01-13 DIAGNOSIS — Z87891 Personal history of nicotine dependence: Secondary | ICD-10-CM | POA: Insufficient documentation

## 2012-01-13 DIAGNOSIS — M4120 Other idiopathic scoliosis, site unspecified: Secondary | ICD-10-CM | POA: Insufficient documentation

## 2012-01-13 DIAGNOSIS — F3289 Other specified depressive episodes: Secondary | ICD-10-CM | POA: Insufficient documentation

## 2012-01-13 DIAGNOSIS — Z8719 Personal history of other diseases of the digestive system: Secondary | ICD-10-CM | POA: Insufficient documentation

## 2012-01-13 DIAGNOSIS — Z79899 Other long term (current) drug therapy: Secondary | ICD-10-CM | POA: Insufficient documentation

## 2012-01-13 DIAGNOSIS — X58XXXA Exposure to other specified factors, initial encounter: Secondary | ICD-10-CM | POA: Insufficient documentation

## 2012-01-13 DIAGNOSIS — F329 Major depressive disorder, single episode, unspecified: Secondary | ICD-10-CM | POA: Insufficient documentation

## 2012-01-13 DIAGNOSIS — F411 Generalized anxiety disorder: Secondary | ICD-10-CM | POA: Insufficient documentation

## 2012-01-13 DIAGNOSIS — T148XXA Other injury of unspecified body region, initial encounter: Secondary | ICD-10-CM

## 2012-01-13 MED ORDER — HYDROCODONE-ACETAMINOPHEN 5-325 MG PO TABS: 2.0000 | ORAL_TABLET | ORAL | Status: DC | PRN

## 2012-01-13 MED ORDER — CYCLOBENZAPRINE HCL 10 MG PO TABS: 10.0000 mg | ORAL_TABLET | Freq: Two times a day (BID) | ORAL | Status: DC | PRN

## 2012-01-13 NOTE — ED Provider Notes (Signed)
Medical screening examination/treatment/procedure(s) were performed by non-physician practitioner and as supervising physician I was immediately available for consultation/collaboration.    Nelia Shi, MD 01/13/12 1945

## 2012-01-13 NOTE — ED Notes (Signed)
Pain across his shoulders x 3 weeks. Thinks he may have pulled a muscle.

## 2012-01-13 NOTE — ED Provider Notes (Signed)
History     CSN: [id number]  Arrival date & time 01/13/12  1230   First MD Initiated Contact with Patient 01/13/12 1318      Chief Complaint  Patient presents with  . 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 male presenting with back pain. The history is provided by the patient. No language interpreter was used.  Back Pain  This is a new problem. The current episode started more than 1 week ago. The problem occurs daily. The problem has not changed since onset.Associated with: movement. The pain is present in the thoracic spine. The quality of the pain is described as aching. The pain does not radiate. The pain is moderate. The pain is the same all the time. He has tried NSAIDs for the symptoms. The treatment provided no relief.    Past Medical History  Diagnosis Date  . Anxiety and depression   . Reflux     Past Surgical History  Procedure Date  . Ankle surgery   . Foot surgery     No family history on file.  History  Substance Use Topics  . Smoking status: Former Games developer  . Smokeless tobacco: Not on file  . Alcohol Use: No      Review of Systems  Respiratory: Negative.   Cardiovascular: Negative.   Musculoskeletal: Positive for back pain.    Allergies  Review of patient's allergies indicates no known allergies.  Home Medications   Current Outpatient Rx  Name  Route  Sig  Dispense  Refill  . CYCLOBENZAPRINE HCL 10 MG PO TABS   Oral   Take 1 tablet (10 mg total) by mouth 2 (two) times daily as needed for muscle spasms.   20 tablet   0   . HYDROCODONE-ACETAMINOPHEN 5-325 MG PO TABS   Oral   Take 2 tablets  by mouth every 4 (four) hours as needed for pain.   15 tablet   0   . LORAZEPAM 1 MG PO TABS   Oral   Take 2-3 mg by mouth 2 (two) times daily between meals as needed. 2 during the day if needed and 3 at night for anxiety         . MIRTAZAPINE 15 MG PO TABS   Oral   Take 7.5 mg by mouth at bedtime.         Marland Kitchen NABUMETONE 500 MG PO TABS   Oral   Take 500 mg by mouth daily.         Marland Kitchen OVER THE COUNTER MEDICATION      [employer] 1 scoop daily         . OXYCODONE HCL 15 MG PO TABS   Oral   Take 15 mg by mouth every 4 (four) hours as needed. For pain           BP 133/90  Pulse 79  Temp 98 F (36.7 C) (Oral)  Resp 20  Ht 5\' 7"  (1.702 m)  Wt 218 lb (98.884 kg)  BMI 34.14 kg/m2  SpO2 97%  Physical Exam  Nursing note and vitals reviewed. Constitutional: He is oriented to person, place, and time. He appears well-developed and well-nourished.  Cardiovascular: Normal rate.   Pulmonary/Chest: Breath sounds normal.  Musculoskeletal:       Thoracic paraspinal tenderness:grib strength equal  Neurological: He is alert and oriented to person, place, and time. Coordination normal.  Skin: Skin is warm and dry.  Psychiatric: He has a normal mood and affect.    ED Course  Procedures (including critical care time)  Labs Reviewed - No data to display No results found.   1. Muscle strain       MDM  Pt not having any deficits:pt has history of scoliosis:likely musculoskeletal:will treat and pt is okay to follow up with Dr. Pearletha Forge if continued symptoms after symptomatic treatment        Teressa Lower, NP 01/13/12 1429

## 2012-01-22 ENCOUNTER — Emergency Department (HOSPITAL_BASED_OUTPATIENT_CLINIC_OR_DEPARTMENT_OTHER): Payer: Medicaid Other

## 2012-01-22 ENCOUNTER — Encounter (HOSPITAL_BASED_OUTPATIENT_CLINIC_OR_DEPARTMENT_OTHER): Payer: Self-pay | Admitting: *Deleted

## 2012-01-22 ENCOUNTER — Emergency Department (HOSPITAL_BASED_OUTPATIENT_CLINIC_OR_DEPARTMENT_OTHER)
Admission: EM | Admit: 2012-01-22 | Discharge: 2012-01-22 | Disposition: A | Discharge status: Home / Self Care | Payer: Medicaid Other | Attending: Emergency Medicine | Admitting: Emergency Medicine

## 2012-01-22 DIAGNOSIS — F329 Major depressive disorder, single episode, unspecified: Secondary | ICD-10-CM | POA: Insufficient documentation

## 2012-01-22 DIAGNOSIS — J029 Acute pharyngitis, unspecified: Secondary | ICD-10-CM

## 2012-01-22 DIAGNOSIS — Z87891 Personal history of nicotine dependence: Secondary | ICD-10-CM | POA: Insufficient documentation

## 2012-01-22 DIAGNOSIS — F3289 Other specified depressive episodes: Secondary | ICD-10-CM | POA: Insufficient documentation

## 2012-01-22 DIAGNOSIS — Z79899 Other long term (current) drug therapy: Secondary | ICD-10-CM | POA: Insufficient documentation

## 2012-01-22 DIAGNOSIS — R05 Cough: Secondary | ICD-10-CM | POA: Insufficient documentation

## 2012-01-22 DIAGNOSIS — K219 Gastro-esophageal reflux disease without esophagitis: Secondary | ICD-10-CM | POA: Insufficient documentation

## 2012-01-22 DIAGNOSIS — F411 Generalized anxiety disorder: Secondary | ICD-10-CM | POA: Insufficient documentation

## 2012-01-22 DIAGNOSIS — H1130 Conjunctival hemorrhage, unspecified eye: Secondary | ICD-10-CM | POA: Insufficient documentation

## 2012-01-22 DIAGNOSIS — H5789 Other specified disorders of eye and adnexa: Secondary | ICD-10-CM | POA: Insufficient documentation

## 2012-01-22 DIAGNOSIS — R059 Cough, unspecified: Secondary | ICD-10-CM

## 2012-01-22 MED ORDER — HYDROCODONE-ACETAMINOPHEN 5-325 MG PO TABS: 2.0000 | ORAL_TABLET | ORAL | Status: DC | PRN

## 2012-01-22 MED ORDER — HYDROMORPHONE HCL PF 2 MG/ML IJ SOLN
2.0000 mg | Freq: Once | INTRAMUSCULAR | Status: AC
  Administered 2012-01-22: 2 mg via INTRAMUSCULAR
  Filled 2012-01-22: qty 1

## 2012-01-22 MED ORDER — OXYCODONE-ACETAMINOPHEN 5-325 MG PO TABS: 2.0000 | ORAL_TABLET | ORAL | Status: DC | PRN

## 2012-01-22 MED ORDER — ONDANSETRON HCL 4 MG/2ML IJ SOLN
4.0000 mg | Freq: Once | INTRAMUSCULAR | Status: DC
  Filled 2012-01-22: qty 2

## 2012-01-22 MED ORDER — ONDANSETRON HCL 4 MG/2ML IJ SOLN
4.0000 mg | Freq: Once | INTRAMUSCULAR | Status: AC
  Administered 2012-01-22: 4 mg via INTRAMUSCULAR

## 2012-01-22 NOTE — ED Notes (Signed)
Had surgery on his left foot today to have a stent removed. Here for c.o blood shot right eye and sob. Does not appear to be in resp distress.

## 2012-01-22 NOTE — ED Provider Notes (Signed)
History     CSN: [id number]  Arrival date & time 01/22/12  1735   None     Chief Complaint  Patient presents with  . Shortness of Breath    (Consider location/radiation/quality/duration/timing/severity/associated sxs/prior treatment) Patient is a 41 y.o. male presenting with eye problem. The history is provided by the patient. No language interpreter was used.  Eye Problem  This is a new problem. The problem occurs constantly. The problem has been gradually worsening. The right eye is affected.Injury mechanism: Pt had surgery today. The pain is at a severity of 3/10. The pain is moderate. There is no history of trauma to the eye. There is no known exposure to pink eye. Associated symptoms include blurred vision and eye redness. He has tried nothing for the symptoms.  Pt reports he had foot surgery today.   Pt reports his throat feels irritated and feels like something is stuck in it.  Pt reports he also has had a cough since surgery.  Pt reports eye is red   Past Medical History  Diagnosis Date  . Anxiety and depression   . Reflux     Past Surgical History  Procedure Date  . Ankle surgery   . Foot surgery     No family history on file.  History  Substance Use Topics  . Smoking status: Former Games developer  . Smokeless tobacco: Not on file  . Alcohol Use: No      Review of Systems  Eyes: Positive for blurred vision and redness.  Respiratory: Positive for cough.   All other systems reviewed and are negative.    Allergies  Review of patient's allergies indicates no known allergies.  Home Medications   Current Outpatient Rx  Name  Route  Sig  Dispense  Refill  . ACETAMINOPHEN-CODEINE #3 300-30 MG PO TABS   Oral   Take 1 tablet by mouth every 4 (four) hours as needed.         Marland Kitchen HYDROXYZINE HCL 25 MG PO TABS   Oral   Take 25 mg by mouth 3 (three) times daily as needed.         Marland Kitchen NABUMETONE 500 MG PO TABS   Oral   Take 500 mg by mouth 2 (two) times daily.        . TRAZODONE HCL 50 MG PO TABS   Oral   Take 50 mg by mouth at bedtime.         . CYCLOBENZAPRINE HCL 10 MG PO TABS   Oral   Take 1 tablet (10 mg total) by mouth 2 (two) times daily as needed for muscle spasms.   20 tablet   0   . HYDROCODONE-ACETAMINOPHEN 5-325 MG PO TABS   Oral   Take 2 tablets by mouth every 4 (four) hours as needed for pain.   15 tablet   0   . LORAZEPAM 1 MG PO TABS   Oral   Take 2-3 mg by mouth 2 (two) times daily between meals as needed. 2 during the day if needed and 3 at night for anxiety         . MIRTAZAPINE 15 MG PO TABS   Oral   Take 7.5 mg by mouth at bedtime.         Marland Kitchen NABUMETONE 500 MG PO TABS   Oral   Take 500 mg by mouth daily.         Marland Kitchen OVER THE COUNTER MEDICATION      [employer]  superfood 1 scoop daily         . OXYCODONE HCL 15 MG PO TABS   Oral   Take 15 mg by mouth every 4 (four) hours as needed. For pain           BP 123/74  Pulse 92  Temp 98.8 F (37.1 C) (Oral)  Resp 20  SpO2 98%  Physical Exam  Vitals reviewed. Constitutional: He is oriented to person, place, and time. He appears well-developed and well-nourished.  HENT:  Head: Normocephalic and atraumatic.  Right Ear: External ear normal.  Left Ear: External ear normal.  Nose: Nose normal.  Mouth/Throat: Oropharynx is clear and moist.  Eyes: EOM are normal.       Injected conjuntiva, subconjunctiva hemmorhage.  Neck: Normal range of motion. Neck supple.  Cardiovascular: Normal rate and normal heart sounds.   Pulmonary/Chest: Effort normal and breath sounds normal.  Abdominal: Soft. Bowel sounds are normal.  Musculoskeletal: Normal range of motion.  Neurological: He is alert and oriented to person, place, and time. He has normal reflexes.  Skin: Skin is warm.  Psychiatric: He has a normal mood and affect.    ED Course  Procedures (including critical care time)  Labs Reviewed - No data to display No results found.   1. Subconjunctival  hemorrhage   2. Cough   3. Sore throat       MDM   Pt given rx for oxycodone.         Lonia Skinner [place], [place] 01/22/12 2129

## 2012-01-22 NOTE — ED Provider Notes (Signed)
Medical screening examination/treatment/procedure(s) were performed by non-physician practitioner and as supervising physician I was immediately available for consultation/collaboration.  Ethelda Chick, MD 01/22/12 2130

## 2012-01-22 NOTE — ED Notes (Signed)
PA at bedside.

## 2012-02-08 ENCOUNTER — Emergency Department (HOSPITAL_BASED_OUTPATIENT_CLINIC_OR_DEPARTMENT_OTHER)
Admission: EM | Admit: 2012-02-08 | Discharge: 2012-02-08 | Disposition: A | Discharge status: Home / Self Care | Payer: Medicaid Other | Attending: Emergency Medicine | Admitting: Emergency Medicine

## 2012-02-08 ENCOUNTER — Emergency Department (HOSPITAL_BASED_OUTPATIENT_CLINIC_OR_DEPARTMENT_OTHER): Payer: Medicaid Other

## 2012-02-08 ENCOUNTER — Encounter (HOSPITAL_BASED_OUTPATIENT_CLINIC_OR_DEPARTMENT_OTHER): Payer: Self-pay | Admitting: *Deleted

## 2012-02-08 DIAGNOSIS — Z8719 Personal history of other diseases of the digestive system: Secondary | ICD-10-CM | POA: Insufficient documentation

## 2012-02-08 DIAGNOSIS — Y92009 Unspecified place in unspecified non-institutional (private) residence as the place of occurrence of the external cause: Secondary | ICD-10-CM | POA: Insufficient documentation

## 2012-02-08 DIAGNOSIS — Y9389 Activity, other specified: Secondary | ICD-10-CM | POA: Insufficient documentation

## 2012-02-08 DIAGNOSIS — S93402A Sprain of unspecified ligament of left ankle, initial encounter: Secondary | ICD-10-CM

## 2012-02-08 DIAGNOSIS — Z79899 Other long term (current) drug therapy: Secondary | ICD-10-CM | POA: Insufficient documentation

## 2012-02-08 DIAGNOSIS — X500XXA Overexertion from strenuous movement or load, initial encounter: Secondary | ICD-10-CM | POA: Insufficient documentation

## 2012-02-08 DIAGNOSIS — F341 Dysthymic disorder: Secondary | ICD-10-CM | POA: Insufficient documentation

## 2012-02-08 DIAGNOSIS — S93409A Sprain of unspecified ligament of unspecified ankle, initial encounter: Secondary | ICD-10-CM | POA: Insufficient documentation

## 2012-02-08 MED ORDER — OXYCODONE-ACETAMINOPHEN 5-325 MG PO TABS: 2.0000 | ORAL_TABLET | ORAL | Status: DC | PRN

## 2012-02-08 MED ORDER — HYDROCODONE-ACETAMINOPHEN 5-325 MG PO TABS
2.0000 | ORAL_TABLET | Freq: Once | ORAL | Status: AC
  Administered 2012-02-08: 2 via ORAL
  Filled 2012-02-08: qty 2

## 2012-02-08 NOTE — Progress Notes (Signed)
Patient crutches adjusted appropriately. Patient declining crutch teaching, expressing he is "all too familiar" with how to use them.

## 2012-02-08 NOTE — ED Notes (Signed)
Pt reports he rolled left ankle while breaking up dog fight yesterday

## 2012-02-08 NOTE — ED Provider Notes (Signed)
History     CSN: [id number]  Arrival date & time 02/08/12  1727   First MD Initiated Contact with Patient 02/08/12 1907      Chief Complaint  Patient presents with  . Ankle Pain    (Consider location/radiation/quality/duration/timing/severity/associated sxs/prior treatment) Patient is a 41 y.o. male presenting with ankle pain. The history is provided by the patient. No language interpreter was used.  Ankle Pain  The incident occurred less than 1 hour ago. The incident occurred at home. The injury mechanism was torsion. The pain is present in the left ankle. The quality of the pain is described as aching. The pain is at a severity of 6/10. The pain is moderate. The pain has been constant since onset. He reports no foreign bodies present. Nothing aggravates the symptoms. He has tried nothing for the symptoms. The treatment provided moderate relief.  Pt complains   Past Medical History  Diagnosis Date  . Anxiety and depression   . Reflux     Past Surgical History  Procedure Date  . Ankle surgery   . Foot surgery     No family history on file.  History  Substance Use Topics  . Smoking status: Former Games developer  . Smokeless tobacco: Never Used  . Alcohol Use: No      Review of Systems  Allergies  Review of patient's allergies indicates no known allergies.  Home Medications   Current Outpatient Rx  Name  Route  Sig  Dispense  Refill  . ACETAMINOPHEN-CODEINE #3 300-30 MG PO TABS   Oral   Take 1 tablet by mouth every 4 (four) hours as needed.         . CYCLOBENZAPRINE HCL 10 MG PO TABS   Oral   Take 1 tablet (10 mg total) by mouth 2 (two) times daily as needed for muscle spasms.   20 tablet   0   . HYDROXYZINE HCL 25 MG PO TABS   Oral   Take 25 mg by mouth 3 (three) times daily as needed.         Marland Kitchen MIRTAZAPINE 15 MG PO TABS   Oral   Take 7.5 mg by mouth at bedtime.         Marland Kitchen OVER THE COUNTER MEDICATION      [employer] 1 scoop daily         .  OXYCODONE HCL 15 MG PO TABS   Oral   Take 15 mg by mouth every 4 (four) hours as needed. For pain         . OXYCODONE-ACETAMINOPHEN 5-325 MG PO TABS   Oral   Take 2 tablets by mouth every 4 (four) hours as needed for pain.   16 tablet   0   . TRAZODONE HCL 50 MG PO TABS   Oral   Take 50 mg by mouth at bedtime.         Marland Kitchen HYDROCODONE-ACETAMINOPHEN 5-325 MG PO TABS   Oral   Take 2 tablets by mouth every 4 (four) hours as needed for pain.   15 tablet   0   . HYDROCODONE-ACETAMINOPHEN 5-325 MG PO TABS   Oral   Take 2 tablets by mouth every 4 (four) hours as needed for pain.   16 tablet   0   . LORAZEPAM 1 MG PO TABS   Oral   Take 2-3 mg by mouth 2 (two) times daily between meals as needed. 2 during the day if needed and 3 at  night for anxiety         . NABUMETONE 500 MG PO TABS   Oral   Take 500 mg by mouth daily.         Marland Kitchen NABUMETONE 500 MG PO TABS   Oral   Take 500 mg by mouth 2 (two) times daily.           BP 127/81  Pulse 76  Temp 98.7 F (37.1 C) (Oral)  Resp 18  SpO2 95%  Physical Exam  Nursing note and vitals reviewed. Constitutional: He is oriented to person, place, and time. He appears well-developed and well-nourished.  HENT:  Head: Normocephalic.  Musculoskeletal: He exhibits tenderness.       Swollen tender left ankle,  Healed incison  Neurological: He is alert and oriented to person, place, and time. He has normal reflexes.  Skin: Skin is warm.  Psychiatric: He has a normal mood and affect.    ED Course  Procedures (including critical care time)  Labs Reviewed - No data to display Dg Ankle Complete Left  02/08/2012  *RADIOLOGY REPORT*  Clinical Data: Ankle pain  LEFT ANKLE COMPLETE - 3+ VIEW  Comparison: None.  Findings: Three views of the left ankle submitted.  No acute fracture or subluxation.  Ankle mortise is preserved.  Metallic fixation material noted tarsal region.  IMPRESSION: No acute fracture or subluxation.   Original Report  Authenticated By: Natasha Mead, M.D.      No diagnosis found.    MDM  Pt placed in an aso and given crutches.   Pt advised to see his MD for recheck this week.       Lonia Skinner [place], [place] 02/08/12 2024

## 2012-02-08 NOTE — ED Provider Notes (Signed)
Medical screening examination/treatment/procedure(s) were performed by non-physician practitioner and as supervising physician I was immediately available for consultation/collaboration.   Ren Aspinall, MD 02/08/12 2307 

## 2012-03-09 ENCOUNTER — Emergency Department (HOSPITAL_BASED_OUTPATIENT_CLINIC_OR_DEPARTMENT_OTHER)
Admission: EM | Admit: 2012-03-09 | Discharge: 2012-03-10 | Disposition: A | Discharge status: Home / Self Care | Payer: Medicaid Other | Attending: Emergency Medicine | Admitting: Emergency Medicine

## 2012-03-09 ENCOUNTER — Emergency Department (HOSPITAL_BASED_OUTPATIENT_CLINIC_OR_DEPARTMENT_OTHER): Payer: Medicaid Other

## 2012-03-09 ENCOUNTER — Encounter (HOSPITAL_BASED_OUTPATIENT_CLINIC_OR_DEPARTMENT_OTHER): Payer: Self-pay | Admitting: *Deleted

## 2012-03-09 DIAGNOSIS — Y929 Unspecified place or not applicable: Secondary | ICD-10-CM | POA: Insufficient documentation

## 2012-03-09 DIAGNOSIS — Z79899 Other long term (current) drug therapy: Secondary | ICD-10-CM | POA: Insufficient documentation

## 2012-03-09 DIAGNOSIS — Z9889 Other specified postprocedural states: Secondary | ICD-10-CM | POA: Insufficient documentation

## 2012-03-09 DIAGNOSIS — Y9389 Activity, other specified: Secondary | ICD-10-CM | POA: Insufficient documentation

## 2012-03-09 DIAGNOSIS — S93409A Sprain of unspecified ligament of unspecified ankle, initial encounter: Secondary | ICD-10-CM

## 2012-03-09 DIAGNOSIS — F411 Generalized anxiety disorder: Secondary | ICD-10-CM | POA: Insufficient documentation

## 2012-03-09 DIAGNOSIS — W108XXA Fall (on) (from) other stairs and steps, initial encounter: Secondary | ICD-10-CM | POA: Insufficient documentation

## 2012-03-09 DIAGNOSIS — Z87891 Personal history of nicotine dependence: Secondary | ICD-10-CM | POA: Insufficient documentation

## 2012-03-09 NOTE — ED Notes (Signed)
Ankle injury. He was walking down steps and twisted his left ankle.

## 2012-03-09 NOTE — ED Notes (Signed)
Pt. Reports "rolling" his L ankle with much detail.

## 2012-03-10 MED ORDER — OXYCODONE-ACETAMINOPHEN 5-325 MG PO TABS
ORAL_TABLET | ORAL | Status: AC
  Administered 2012-03-10: 01:00:00
  Filled 2012-03-10: qty 1

## 2012-03-10 NOTE — ED Notes (Signed)
ASO applied to left ankle by Lequita Halt, EMT. Pt tolerated, +PMS post application. Pt ambulated with a steady gait.

## 2012-03-10 NOTE — ED Provider Notes (Signed)
See Epic downtime documentation.   Hanley Seamen, MD 03/10/12 [phone]

## 2012-03-10 NOTE — ED Notes (Signed)
MD at bedside. 

## 2012-04-30 ENCOUNTER — Emergency Department (HOSPITAL_BASED_OUTPATIENT_CLINIC_OR_DEPARTMENT_OTHER)
Admission: EM | Admit: 2012-04-30 | Discharge: 2012-04-30 | Disposition: A | Discharge status: Home / Self Care | Payer: Medicaid Other | Attending: Emergency Medicine | Admitting: Emergency Medicine

## 2012-04-30 ENCOUNTER — Encounter (HOSPITAL_BASED_OUTPATIENT_CLINIC_OR_DEPARTMENT_OTHER): Payer: Self-pay | Admitting: Emergency Medicine

## 2012-04-30 DIAGNOSIS — Z79899 Other long term (current) drug therapy: Secondary | ICD-10-CM | POA: Insufficient documentation

## 2012-04-30 DIAGNOSIS — Z87891 Personal history of nicotine dependence: Secondary | ICD-10-CM | POA: Insufficient documentation

## 2012-04-30 DIAGNOSIS — F341 Dysthymic disorder: Secondary | ICD-10-CM | POA: Insufficient documentation

## 2012-04-30 DIAGNOSIS — L0591 Pilonidal cyst without abscess: Secondary | ICD-10-CM

## 2012-04-30 DIAGNOSIS — Z8719 Personal history of other diseases of the digestive system: Secondary | ICD-10-CM | POA: Insufficient documentation

## 2012-04-30 MED ORDER — OXYCODONE-ACETAMINOPHEN 5-325 MG PO TABS
1.0000 | ORAL_TABLET | Freq: Once | ORAL | Status: DC
  Filled 2012-04-30 (×2): qty 1

## 2012-04-30 MED ORDER — OXYCODONE-ACETAMINOPHEN 5-325 MG PO TABS: 1.0000 | ORAL_TABLET | ORAL | Status: DC | PRN

## 2012-04-30 MED ORDER — HYDROMORPHONE HCL PF 1 MG/ML IJ SOLN
1.0000 mg | Freq: Once | INTRAMUSCULAR | Status: AC
  Administered 2012-04-30: 1 mg via INTRAMUSCULAR
  Filled 2012-04-30: qty 1

## 2012-04-30 MED ORDER — KETOROLAC TROMETHAMINE 60 MG/2ML IM SOLN
60.0000 mg | Freq: Once | INTRAMUSCULAR | Status: AC
  Administered 2012-04-30: 60 mg via INTRAMUSCULAR
  Filled 2012-04-30: qty 2

## 2012-04-30 NOTE — ED Notes (Signed)
Pt reports large knot to mid lower back that has grown in size over last 2 months, in last 2 days pain has gotten so severe he can not sit or lay on it

## 2012-04-30 NOTE — ED Provider Notes (Signed)
History     CSN: [id number]  Arrival date & time 04/30/12  1918   First MD Initiated Contact with Patient 04/30/12 1945      Chief Complaint  Patient presents with  . Back Pain    (Consider location/radiation/quality/duration/timing/severity/associated sxs/prior treatment) HPI Carlos Christensen is a 42 y.o. male who presents to the ED with back pain. The pain is located in the mid lower back. This is a new problem. The onset was gradual. The pain started with just a mild ache 2 or 3 months ago. The past 2 night the pain has increased and the patient has had difficulty sleeping. The pain does not radiate.  Denies UTI symptoms, nausea or vomiting or any other problems. The history was provided by the patient.  Past Medical History  Diagnosis Date  . Anxiety and depression   . Reflux     Past Surgical History  Procedure Laterality Date  . Ankle surgery    . Foot surgery      History reviewed. No pertinent family history.  History  Substance Use Topics  . Smoking status: Former Games developer  . Smokeless tobacco: Never Used  . Alcohol Use: No      Review of Systems  Constitutional: Negative for fever, chills, activity change and fatigue.  HENT: Negative for ear pain, congestion, sore throat, facial swelling, neck pain, neck stiffness, dental problem and sinus pressure.   Eyes: Negative for photophobia, pain, discharge and visual disturbance.  Respiratory: Negative for cough, chest tightness and wheezing.   Cardiovascular: Negative for chest pain and palpitations.  Gastrointestinal: Negative for nausea, vomiting, abdominal pain, diarrhea, constipation and abdominal distention.  Genitourinary: Negative for dysuria, frequency, flank pain and difficulty urinating.  Musculoskeletal: Positive for back pain. Negative for myalgias and gait problem.  Skin: Negative for color change and rash.  Allergic/Immunologic: Negative for immunocompromised state.  Neurological: Positive for headaches.  Negative for dizziness, speech difficulty, weakness, light-headedness and numbness.  Psychiatric/Behavioral: Negative for confusion and agitation. Nervous/anxious: taking medication for anxiety and depression.     Allergies  Review of patient's allergies indicates no known allergies.  Home Medications   Current Outpatient Rx  Name  Route  Sig  Dispense  Refill  . acetaminophen-codeine (TYLENOL #3) 300-30 MG per tablet   Oral   Take 1 tablet by mouth every 4 (four) hours as needed.         . mirtazapine (REMERON) 15 MG tablet   Oral   Take 7.5 mg by mouth at bedtime.         . nabumetone (RELAFEN) 500 MG tablet   Oral   Take 500 mg by mouth daily.         . nabumetone (RELAFEN) 500 MG tablet   Oral   Take 500 mg by mouth 2 (two) times daily.         Marland Kitchen oxyCODONE (ROXICODONE) 15 MG immediate release tablet   Oral   Take 15 mg by mouth every 4 (four) hours as needed. For pain         . traZODone (DESYREL) 50 MG tablet   Oral   Take 50 mg by mouth at bedtime.         . cyclobenzaprine (FLEXERIL) 10 MG tablet   Oral   Take 1 tablet (10 mg total) by mouth 2 (two) times daily as needed for muscle spasms.   20 tablet   0   . HYDROcodone-acetaminophen (NORCO/VICODIN) 5-325 MG per tablet   Oral  Take 2 tablets by mouth every 4 (four) hours as needed for pain.   15 tablet   0   . HYDROcodone-acetaminophen (NORCO/VICODIN) 5-325 MG per tablet   Oral   Take 2 tablets by mouth every 4 (four) hours as needed for pain.   16 tablet   0   . hydrOXYzine (ATARAX/VISTARIL) 25 MG tablet   Oral   Take 25 mg by mouth 3 (three) times daily as needed.         Marland Kitchen LORazepam (ATIVAN) 1 MG tablet   Oral   Take 2-3 mg by mouth 2 (two) times daily between meals as needed. 2 during the day if needed and 3 at night for anxiety         . OVER THE COUNTER MEDICATION      [employer] 1 scoop daily         . oxyCODONE-acetaminophen (PERCOCET/ROXICET) 5-325 MG per  tablet   Oral   Take 2 tablets by mouth every 4 (four) hours as needed for pain.   16 tablet   0   . oxyCODONE-acetaminophen (PERCOCET/ROXICET) 5-325 MG per tablet   Oral   Take 2 tablets by mouth every 4 (four) hours as needed for pain.   15 tablet   0     BP 126/73  Pulse 87  Temp(Src) 98.2 F (36.8 C) (Oral)  Resp 16  Ht 5\' 8"  (1.727 m)  Wt 220 lb (99.791 kg)  BMI 33.46 kg/m2  SpO2 96%  Physical Exam  Nursing note and vitals reviewed. Constitutional: He is oriented to person, place, and time. He appears well-developed and well-nourished.  HENT:  Head: Normocephalic and atraumatic.  Eyes: EOM are normal.  Neck: Normal range of motion. Neck supple.  Cardiovascular: Normal rate and regular rhythm.   Pulmonary/Chest: Effort normal.  Abdominal: Soft. There is no tenderness. There is no CVA tenderness.  Musculoskeletal:  There is tenderness with range of motion and palpation of the lower back. Muscle spasm noted.  Neurological: He is alert and oriented to person, place, and time. He has normal strength and normal reflexes. No cranial nerve deficit or sensory deficit. Gait normal.  Skin: Skin is warm and dry.  Psychiatric: He has a normal mood and affect. His behavior is normal. Judgment and thought content normal.   Procedures Assessment: 42 y.o. male with low back pain/muscle spasm  Plan:  Pain management   Muscle relaxant   Follow up with PCP Discussed with the patient and all questioned fully answered.    Medication List    TAKE these medications       oxyCODONE-acetaminophen 5-325 MG per tablet  Commonly known as:  PERCOCET/ROXICET  Take 1 tablet by mouth every 4 (four) hours as needed for pain.      ASK your doctor about these medications       acetaminophen-codeine 300-30 MG per tablet  Commonly known as:  TYLENOL #3  Take 1 tablet by mouth every 4 (four) hours as needed.     cyclobenzaprine 10 MG tablet  Commonly known as:  FLEXERIL  Take 1 tablet  (10 mg total) by mouth 2 (two) times daily as needed for muscle spasms.     HYDROcodone-acetaminophen 5-325 MG per tablet  Commonly known as:  NORCO/VICODIN  Take 2 tablets by mouth every 4 (four) hours as needed for pain.     HYDROcodone-acetaminophen 5-325 MG per tablet  Commonly known as:  NORCO/VICODIN  Take 2 tablets by mouth every  4 (four) hours as needed for pain.     hydrOXYzine 25 MG tablet  Commonly known as:  ATARAX/VISTARIL  Take 25 mg by mouth 3 (three) times daily as needed.     LORazepam 1 MG tablet  Commonly known as:  ATIVAN  Take 2-3 mg by mouth 2 (two) times daily between meals as needed. 2 during the day if needed and 3 at night for anxiety     mirtazapine 15 MG tablet  Commonly known as:  REMERON  Take 7.5 mg by mouth at bedtime.     nabumetone 500 MG tablet  Commonly known as:  RELAFEN  Take 500 mg by mouth daily.     nabumetone 500 MG tablet  Commonly known as:  RELAFEN  Take 500 mg by mouth 2 (two) times daily.     OVER THE COUNTER MEDICATION  [employer] 1 scoop daily     oxyCODONE 15 MG immediate release tablet  Commonly known as:  ROXICODONE  Take 15 mg by mouth every 4 (four) hours as needed. For pain     traZODone 50 MG tablet  Commonly known as:  DESYREL  Take 50 mg by mouth at bedtime.           [place], [place] 05/02/12 2140

## 2012-04-30 NOTE — ED Notes (Signed)
Pt. states he noticed a "knot" near his sacral area approximately 2 months ago.  The "knot" has become more painful and enlarged.  He recently returned from [place] on 2/21 and noticed that the pain was worse while driving back.  He scratched the area last night and thinks there may have been some drainage.

## 2012-05-03 NOTE — ED Provider Notes (Signed)
Medical screening examination/treatment/procedure(s) were performed by non-physician practitioner and as supervising physician I was immediately available for consultation/collaboration.  Sarika Baldini R. Jahmal Dunavant, MD 05/03/12 0414 

## 2012-05-06 ENCOUNTER — Emergency Department (HOSPITAL_BASED_OUTPATIENT_CLINIC_OR_DEPARTMENT_OTHER)
Admission: EM | Admit: 2012-05-06 | Discharge: 2012-05-06 | Disposition: A | Discharge status: Home / Self Care | Payer: No Typology Code available for payment source | Attending: Emergency Medicine | Admitting: Emergency Medicine

## 2012-05-06 ENCOUNTER — Encounter (HOSPITAL_BASED_OUTPATIENT_CLINIC_OR_DEPARTMENT_OTHER): Payer: Self-pay | Admitting: Emergency Medicine

## 2012-05-06 ENCOUNTER — Emergency Department (HOSPITAL_BASED_OUTPATIENT_CLINIC_OR_DEPARTMENT_OTHER): Payer: No Typology Code available for payment source

## 2012-05-06 DIAGNOSIS — Y92410 Unspecified street and highway as the place of occurrence of the external cause: Secondary | ICD-10-CM | POA: Insufficient documentation

## 2012-05-06 DIAGNOSIS — F341 Dysthymic disorder: Secondary | ICD-10-CM | POA: Insufficient documentation

## 2012-05-06 DIAGNOSIS — Z87891 Personal history of nicotine dependence: Secondary | ICD-10-CM | POA: Insufficient documentation

## 2012-05-06 DIAGNOSIS — Y9389 Activity, other specified: Secondary | ICD-10-CM | POA: Insufficient documentation

## 2012-05-06 DIAGNOSIS — Z8719 Personal history of other diseases of the digestive system: Secondary | ICD-10-CM | POA: Insufficient documentation

## 2012-05-06 DIAGNOSIS — IMO0002 Reserved for concepts with insufficient information to code with codable children: Secondary | ICD-10-CM | POA: Insufficient documentation

## 2012-05-06 DIAGNOSIS — M62838 Other muscle spasm: Secondary | ICD-10-CM

## 2012-05-06 MED ORDER — MELOXICAM 7.5 MG PO TABS: 7.5000 mg | ORAL_TABLET | Freq: Every day | ORAL | Status: DC

## 2012-05-06 MED ORDER — METHOCARBAMOL 500 MG PO TABS: 500.0000 mg | ORAL_TABLET | Freq: Two times a day (BID) | ORAL | Status: DC

## 2012-05-06 MED ORDER — METHOCARBAMOL 500 MG PO TABS
1000.0000 mg | ORAL_TABLET | Freq: Once | ORAL | Status: AC
  Administered 2012-05-06: 1000 mg via ORAL
  Filled 2012-05-06: qty 2

## 2012-05-06 NOTE — ED Notes (Signed)
MD at bedside giving test results and plan of care. 

## 2012-05-06 NOTE — ED Notes (Signed)
Patient transported to X-ray 
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
## 2012-05-06 NOTE — ED Notes (Signed)
MD at bedside. 
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
## 2012-06-08 ENCOUNTER — Emergency Department (HOSPITAL_BASED_OUTPATIENT_CLINIC_OR_DEPARTMENT_OTHER)
Admission: EM | Admit: 2012-06-08 | Discharge: 2012-06-09 | Disposition: A | Discharge status: Home / Self Care | Payer: Medicaid Other | Attending: Emergency Medicine | Admitting: Emergency Medicine

## 2012-06-08 ENCOUNTER — Encounter (HOSPITAL_BASED_OUTPATIENT_CLINIC_OR_DEPARTMENT_OTHER): Payer: Self-pay | Admitting: *Deleted

## 2012-06-08 ENCOUNTER — Emergency Department (HOSPITAL_BASED_OUTPATIENT_CLINIC_OR_DEPARTMENT_OTHER): Payer: Medicaid Other

## 2012-06-08 DIAGNOSIS — F341 Dysthymic disorder: Secondary | ICD-10-CM | POA: Insufficient documentation

## 2012-06-08 DIAGNOSIS — Z9889 Other specified postprocedural states: Secondary | ICD-10-CM | POA: Insufficient documentation

## 2012-06-08 DIAGNOSIS — Z79899 Other long term (current) drug therapy: Secondary | ICD-10-CM | POA: Insufficient documentation

## 2012-06-08 DIAGNOSIS — M129 Arthropathy, unspecified: Secondary | ICD-10-CM | POA: Insufficient documentation

## 2012-06-08 DIAGNOSIS — K219 Gastro-esophageal reflux disease without esophagitis: Secondary | ICD-10-CM | POA: Insufficient documentation

## 2012-06-08 DIAGNOSIS — Z791 Long term (current) use of non-steroidal anti-inflammatories (NSAID): Secondary | ICD-10-CM | POA: Insufficient documentation

## 2012-06-08 DIAGNOSIS — Z87891 Personal history of nicotine dependence: Secondary | ICD-10-CM | POA: Insufficient documentation

## 2012-06-08 DIAGNOSIS — M1990 Unspecified osteoarthritis, unspecified site: Secondary | ICD-10-CM

## 2012-06-08 NOTE — ED Notes (Signed)
Pt c/o left ankle pain x 1 day

## 2012-06-08 NOTE — ED Provider Notes (Signed)
History     CSN: [id number]  Arrival date & time 06/08/12  2309   First MD Initiated Contact with Patient 06/08/12 2335      Chief Complaint  Patient presents with  . Ankle Pain    (Consider location/radiation/quality/duration/timing/severity/associated sxs/prior treatment) HPI Comments: Patient comes to the ER for evaluation of left ankle pain. Patient reports that he has had extensive foot surgery in the past. He has had problems with his Achilles in the past as well. In the last day or so he has been having popping in the lateral aspect of the left ankle associated with pain with walking. He denies any injury. He has not noticed any significant swelling.  Patient is a 42 y.o. male presenting with ankle pain.  Ankle Pain   Past Medical History  Diagnosis Date  . Anxiety and depression   . Reflux     Past Surgical History  Procedure Laterality Date  . Ankle surgery    . Foot surgery      History reviewed. No pertinent family history.  History  Substance Use Topics  . Smoking status: Former Games developer  . Smokeless tobacco: Never Used  . Alcohol Use: No      Review of Systems  Musculoskeletal: Positive for arthralgias.    Allergies  Review of patient's allergies indicates no known allergies.  Home Medications   Current Outpatient Rx  Name  Route  Sig  Dispense  Refill  . acetaminophen-codeine (TYLENOL #3) 300-30 MG per tablet   Oral   Take 1 tablet by mouth every 4 (four) hours as needed.         . cyclobenzaprine (FLEXERIL) 10 MG tablet   Oral   Take 1 tablet (10 mg total) by mouth 2 (two) times daily as needed for muscle spasms.   20 tablet   0   . HYDROcodone-acetaminophen (NORCO/VICODIN) 5-325 MG per tablet   Oral   Take 2 tablets by mouth every 4 (four) hours as needed for pain.   15 tablet   0   . HYDROcodone-acetaminophen (NORCO/VICODIN) 5-325 MG per tablet   Oral   Take 2 tablets by mouth every 4 (four) hours as needed for pain.   16  tablet   0   . hydrOXYzine (ATARAX/VISTARIL) 25 MG tablet   Oral   Take 25 mg by mouth 3 (three) times daily as needed.         Marland Kitchen LORazepam (ATIVAN) 1 MG tablet   Oral   Take 2-3 mg by mouth 2 (two) times daily between meals as needed. 2 during the day if needed and 3 at night for anxiety         . meloxicam (MOBIC) 7.5 MG tablet   Oral   Take 1 tablet (7.5 mg total) by mouth daily.   7 tablet   0   . methocarbamol (ROBAXIN) 500 MG tablet   Oral   Take 1 tablet (500 mg total) by mouth 2 (two) times daily.   20 tablet   0   . mirtazapine (REMERON) 15 MG tablet   Oral   Take 7.5 mg by mouth at bedtime.         . nabumetone (RELAFEN) 500 MG tablet   Oral   Take 500 mg by mouth daily.         . nabumetone (RELAFEN) 500 MG tablet   Oral   Take 500 mg by mouth 2 (two) times daily.         Marland Kitchen  OVER THE COUNTER MEDICATION      [employer] 1 scoop daily         . oxyCODONE (ROXICODONE) 15 MG immediate release tablet   Oral   Take 15 mg by mouth every 4 (four) hours as needed. For pain         . oxyCODONE-acetaminophen (PERCOCET/ROXICET) 5-325 MG per tablet   Oral   Take 1 tablet by mouth every 4 (four) hours as needed for pain.   20 tablet   0   . traZODone (DESYREL) 50 MG tablet   Oral   Take 50 mg by mouth at bedtime.           BP 117/97  Pulse 84  Temp(Src) 98.6 F (37 C) (Oral)  Resp 16  Ht 5\' 8"  (1.727 m)  Wt 215 lb (97.523 kg)  BMI 32.7 kg/m2  SpO2 100%  Physical Exam  Constitutional: He is oriented to person, place, and time.  Cardiovascular: Exam reveals no decreased pulses.   Pulses:      Dorsalis pedis pulses are 2+ on the left side.  Musculoskeletal:       Left ankle: He exhibits normal range of motion, no swelling, no ecchymosis, no deformity, no laceration and normal pulse. Tenderness. Lateral malleolus tenderness found. No head of 5th metatarsal and no proximal fibula tenderness found. Achilles tendon exhibits no defect.   Neurological: He is alert and oriented to person, place, and time. He has normal strength. No cranial nerve deficit or sensory deficit. GCS eye subscore is 4. GCS verbal subscore is 5. GCS motor subscore is 6.  Skin: Skin is warm, dry and intact.    ED Course  Procedures (including critical care time)  Labs Reviewed - No data to display No results found.   Diagnosis: Left ankle arthritis    MDM  Central atraumatic left ankle pain. He has had extensive foot surgery. X-ray of the ankle shows degenerative changes without acute findings. Patient reports that he is out of his pain medications.      [name], MD 06/09/12 [phone]

## 2012-06-09 MED ORDER — TRAMADOL HCL 50 MG PO TABS
50.0000 mg | ORAL_TABLET | Freq: Four times a day (QID) | ORAL | Status: DC | PRN
Start: 2012-06-09 — End: 2012-11-15

## 2012-06-09 MED ORDER — HYDROCODONE-ACETAMINOPHEN 5-325 MG PO TABS
2.0000 | ORAL_TABLET | Freq: Once | ORAL | Status: AC
  Administered 2012-06-09: 2 via ORAL
  Filled 2012-06-09: qty 2

## 2012-06-09 MED ORDER — IBUPROFEN 800 MG PO TABS: 800.0000 mg | ORAL_TABLET | Freq: Three times a day (TID) | ORAL | Status: DC

## 2012-06-09 NOTE — ED Notes (Signed)
rx x 2 for tramadol and ibuprofen- d/c with ride

## 2012-07-11 ENCOUNTER — Encounter (HOSPITAL_BASED_OUTPATIENT_CLINIC_OR_DEPARTMENT_OTHER): Payer: Self-pay | Admitting: *Deleted

## 2012-07-11 ENCOUNTER — Emergency Department (HOSPITAL_BASED_OUTPATIENT_CLINIC_OR_DEPARTMENT_OTHER)
Admission: EM | Admit: 2012-07-11 | Discharge: 2012-07-11 | Discharge status: Against Medical Advice | Payer: Medicaid Other | Attending: Emergency Medicine | Admitting: Emergency Medicine

## 2012-07-11 ENCOUNTER — Emergency Department (HOSPITAL_BASED_OUTPATIENT_CLINIC_OR_DEPARTMENT_OTHER): Payer: Medicaid Other

## 2012-07-11 DIAGNOSIS — R05 Cough: Secondary | ICD-10-CM | POA: Insufficient documentation

## 2012-07-11 DIAGNOSIS — R059 Cough, unspecified: Secondary | ICD-10-CM | POA: Insufficient documentation

## 2012-07-11 DIAGNOSIS — Z87891 Personal history of nicotine dependence: Secondary | ICD-10-CM | POA: Insufficient documentation

## 2012-07-11 LAB — RAPID STREP SCREEN (MED CTR MEBANE ONLY): Streptococcus, Group A Screen (Direct): NEGATIVE

## 2012-07-11 NOTE — ED Notes (Signed)
Prod cough with yellow, white sputum, sore throat, left ear pain for a couple days.

## 2012-07-12 ENCOUNTER — Emergency Department (HOSPITAL_BASED_OUTPATIENT_CLINIC_OR_DEPARTMENT_OTHER)
Admission: EM | Admit: 2012-07-12 | Discharge: 2012-07-12 | Disposition: A | Discharge status: Home / Self Care | Payer: Medicaid Other | Attending: Emergency Medicine | Admitting: Emergency Medicine

## 2012-07-12 ENCOUNTER — Encounter (HOSPITAL_BASED_OUTPATIENT_CLINIC_OR_DEPARTMENT_OTHER): Payer: Self-pay | Admitting: Family Medicine

## 2012-07-12 DIAGNOSIS — Z8719 Personal history of other diseases of the digestive system: Secondary | ICD-10-CM | POA: Insufficient documentation

## 2012-07-12 DIAGNOSIS — F341 Dysthymic disorder: Secondary | ICD-10-CM | POA: Insufficient documentation

## 2012-07-12 DIAGNOSIS — Z79899 Other long term (current) drug therapy: Secondary | ICD-10-CM | POA: Insufficient documentation

## 2012-07-12 DIAGNOSIS — J159 Unspecified bacterial pneumonia: Secondary | ICD-10-CM | POA: Insufficient documentation

## 2012-07-12 DIAGNOSIS — Z87891 Personal history of nicotine dependence: Secondary | ICD-10-CM | POA: Insufficient documentation

## 2012-07-12 DIAGNOSIS — J189 Pneumonia, unspecified organism: Secondary | ICD-10-CM

## 2012-07-12 MED ORDER — AZITHROMYCIN 250 MG PO TABS: 250.0000 mg | ORAL_TABLET | Freq: Every day | ORAL | Status: DC

## 2012-07-12 MED ORDER — HYDROCODONE-ACETAMINOPHEN 5-325 MG PO TABS: 2.0000 | ORAL_TABLET | ORAL | Status: DC | PRN

## 2012-07-12 NOTE — ED Notes (Signed)
Pt had XR chest yesterday that showed L lower lobe pna. Pt called and advised to f/u. Pt returned today for treatment.

## 2012-07-12 NOTE — ED Notes (Signed)
Spoke with pt. Pt sts he will return for f/u

## 2012-07-12 NOTE — ED Provider Notes (Signed)
History     CSN: [id number]  Arrival date & time 07/12/12  1207   First MD Initiated Contact with Patient 07/12/12 1233      Chief Complaint  Patient presents with  . Cough    (Consider location/radiation/quality/duration/timing/severity/associated sxs/prior treatment) HPI Comments: Patient with 5 day history of cough, chest tightness, fever.  Had xray here yesterday but left due to prolonged wait.  The xrays show a left-sided pneumonia.  He was called with this report and told to come back.  Patient is a 42 y.o. male presenting with cough. The history is provided by the patient.  Cough Cough characteristics:  Productive Sputum characteristics:  Yellow Severity:  Moderate Onset quality:  Gradual Duration:  5 days Timing:  Constant Progression:  Worsening Chronicity:  New Relieved by:  Nothing Worsened by:  Nothing tried   Past Medical History  Diagnosis Date  . Anxiety and depression   . Reflux     Past Surgical History  Procedure Laterality Date  . Ankle surgery    . Foot surgery      No family history on file.  History  Substance Use Topics  . Smoking status: Former Games developer  . Smokeless tobacco: Never Used  . Alcohol Use: No      Review of Systems  Respiratory: Positive for cough.   All other systems reviewed and are negative.    Allergies  Review of patient's allergies indicates no known allergies.  Home Medications   Current Outpatient Rx  Name  Route  Sig  Dispense  Refill  . acetaminophen-codeine (TYLENOL #3) 300-30 MG per tablet   Oral   Take 1 tablet by mouth every 4 (four) hours as needed.         . cyclobenzaprine (FLEXERIL) 10 MG tablet   Oral   Take 1 tablet (10 mg total) by mouth 2 (two) times daily as needed for muscle spasms.   20 tablet   0   . HYDROcodone-acetaminophen (NORCO/VICODIN) 5-325 MG per tablet   Oral   Take 2 tablets by mouth every 4 (four) hours as needed for pain.   15 tablet   0   .  HYDROcodone-acetaminophen (NORCO/VICODIN) 5-325 MG per tablet   Oral   Take 2 tablets by mouth every 4 (four) hours as needed for pain.   16 tablet   0   . hydrOXYzine (ATARAX/VISTARIL) 25 MG tablet   Oral   Take 25 mg by mouth 3 (three) times daily as needed.         Marland Kitchen ibuprofen (ADVIL,MOTRIN) 800 MG tablet   Oral   Take 1 tablet (800 mg total) by mouth 3 (three) times daily.   21 tablet   0   . LORazepam (ATIVAN) 1 MG tablet   Oral   Take 2-3 mg by mouth 2 (two) times daily between meals as needed. 2 during the day if needed and 3 at night for anxiety         . meloxicam (MOBIC) 7.5 MG tablet   Oral   Take 1 tablet (7.5 mg total) by mouth daily.   7 tablet   0   . methocarbamol (ROBAXIN) 500 MG tablet   Oral   Take 1 tablet (500 mg total) by mouth 2 (two) times daily.   20 tablet   0   . mirtazapine (REMERON) 15 MG tablet   Oral   Take 7.5 mg by mouth at bedtime.         Marland Kitchen  nabumetone (RELAFEN) 500 MG tablet   Oral   Take 500 mg by mouth daily.         . nabumetone (RELAFEN) 500 MG tablet   Oral   Take 500 mg by mouth 2 (two) times daily.         Marland Kitchen OVER THE COUNTER MEDICATION      [employer] 1 scoop daily         . oxyCODONE (ROXICODONE) 15 MG immediate release tablet   Oral   Take 15 mg by mouth every 4 (four) hours as needed. For pain         . oxyCODONE-acetaminophen (PERCOCET/ROXICET) 5-325 MG per tablet   Oral   Take 1 tablet by mouth every 4 (four) hours as needed for pain.   20 tablet   0   . traMADol (ULTRAM) 50 MG tablet   Oral   Take 1 tablet (50 mg total) by mouth every 6 (six) hours as needed for pain.   15 tablet   0   . traZODone (DESYREL) 50 MG tablet   Oral   Take 50 mg by mouth at bedtime.           Pulse 89  Temp(Src) 99.3 F (37.4 C) (Oral)  Resp 16  SpO2 98%  Physical Exam  Nursing note and vitals reviewed. Constitutional: He is oriented to person, place, and time. He appears well-developed and  well-nourished. No distress.  HENT:  Head: Normocephalic and atraumatic.  Mouth/Throat: Oropharynx is clear and moist.  Neck: Normal range of motion. Neck supple.  Cardiovascular: Normal rate and regular rhythm.   No murmur heard. Pulmonary/Chest: Effort normal. No respiratory distress. He has no wheezes. He has rales.  Rales in the left base.  Abdominal: Soft. Bowel sounds are normal. He exhibits no distension. There is no tenderness.  Musculoskeletal: Normal range of motion. He exhibits no edema.  Lymphadenopathy:    He has no cervical adenopathy.  Neurological: He is alert and oriented to person, place, and time.  Skin: Skin is warm and dry. He is not diaphoretic.    ED Course  Procedures (including critical care time)  Labs Reviewed - No data to display Dg Chest 2 View  07/11/2012  *RADIOLOGY REPORT*  Clinical Data: Cough.  Body aches.  Sore throat.  CHEST - 2 VIEW  Comparison: 01/22/2012.  Findings: Retrocardiac airspace opacity is present localizing to the left lower lobe on the lateral view.  Findings are compatible with pneumonia. Follow-up to ensure radiographic clearing recommended.  Clearing is usually observed at 8 weeks.  The right lung appears clear.  Cardiopericardial silhouette is within normal limits.  S-shaped thoracic scoliosis.  IMPRESSION: Left lower lobe pneumonia.   Original Report Authenticated By: Andreas Newport, M.D.      No diagnosis found.    MDM  Sats okay, no distress.  Will treat with zmax, pain meds.  To return prn.        Geoffery Lyons, MD 07/12/12 1257

## 2012-07-12 NOTE — ED Notes (Signed)
Pt left without being seen and XR results showed pna. Attempted to call pt. With results and discuss follow-up plan. No answer. No msg left.

## 2012-07-14 ENCOUNTER — Emergency Department (HOSPITAL_BASED_OUTPATIENT_CLINIC_OR_DEPARTMENT_OTHER)
Admission: EM | Admit: 2012-07-14 | Discharge: 2012-07-14 | Disposition: A | Discharge status: Home / Self Care | Payer: Medicaid Other | Attending: Emergency Medicine | Admitting: Emergency Medicine

## 2012-07-14 ENCOUNTER — Emergency Department (HOSPITAL_BASED_OUTPATIENT_CLINIC_OR_DEPARTMENT_OTHER): Payer: Medicaid Other

## 2012-07-14 ENCOUNTER — Encounter (HOSPITAL_BASED_OUTPATIENT_CLINIC_OR_DEPARTMENT_OTHER): Payer: Self-pay | Admitting: Student

## 2012-07-14 DIAGNOSIS — Z87891 Personal history of nicotine dependence: Secondary | ICD-10-CM | POA: Insufficient documentation

## 2012-07-14 DIAGNOSIS — Z8719 Personal history of other diseases of the digestive system: Secondary | ICD-10-CM | POA: Insufficient documentation

## 2012-07-14 DIAGNOSIS — Z791 Long term (current) use of non-steroidal anti-inflammatories (NSAID): Secondary | ICD-10-CM | POA: Insufficient documentation

## 2012-07-14 DIAGNOSIS — R51 Headache: Secondary | ICD-10-CM | POA: Insufficient documentation

## 2012-07-14 DIAGNOSIS — F341 Dysthymic disorder: Secondary | ICD-10-CM | POA: Insufficient documentation

## 2012-07-14 DIAGNOSIS — R509 Fever, unspecified: Secondary | ICD-10-CM | POA: Insufficient documentation

## 2012-07-14 DIAGNOSIS — J189 Pneumonia, unspecified organism: Secondary | ICD-10-CM

## 2012-07-14 LAB — CBC WITH DIFFERENTIAL/PLATELET
Basophils Absolute: 0 10*3/uL (ref 0.0–0.1)
Basophils Relative: 0 % (ref 0–1)
HCT: 38 % — ABNORMAL LOW (ref 39.0–52.0)
MCHC: 34.5 g/dL (ref 30.0–36.0)
Monocytes Absolute: 1.1 10*3/uL — ABNORMAL HIGH (ref 0.1–1.0)
Neutro Abs: 5.4 10*3/uL (ref 1.7–7.7)
Neutrophils Relative %: 59 % (ref 43–77)
RDW: 13.1 % (ref 11.5–15.5)

## 2012-07-14 LAB — COMPREHENSIVE METABOLIC PANEL
AST: 48 U/L — ABNORMAL HIGH (ref 0–37)
Albumin: 3.9 g/dL (ref 3.5–5.2)
Calcium: 9.9 mg/dL (ref 8.4–10.5)
Chloride: 100 mEq/L (ref 96–112)
Creatinine, Ser: 0.8 mg/dL (ref 0.50–1.35)
Total Bilirubin: 0.3 mg/dL (ref 0.3–1.2)

## 2012-07-14 MED ORDER — KETOROLAC TROMETHAMINE 30 MG/ML IJ SOLN: 30.0000 mg | Freq: Once | INTRAMUSCULAR | Status: AC

## 2012-07-14 MED ORDER — HYDROCOD POLST-CHLORPHEN POLST 10-8 MG/5ML PO LQCR: 5.0000 mL | Freq: Two times a day (BID) | ORAL | Status: DC

## 2012-07-14 MED ORDER — HYDROMORPHONE HCL PF 1 MG/ML IJ SOLN
1.0000 mg | Freq: Once | INTRAMUSCULAR | Status: AC
  Administered 2012-07-14: 1 mg via INTRAVENOUS
  Filled 2012-07-14: qty 1

## 2012-07-14 MED ORDER — KETOROLAC TROMETHAMINE 30 MG/ML IJ SOLN
INTRAMUSCULAR | Status: AC
  Administered 2012-07-14: 30 mg via INTRAVENOUS
  Filled 2012-07-14: qty 1

## 2012-07-14 MED ORDER — ONDANSETRON HCL 4 MG/2ML IJ SOLN
4.0000 mg | Freq: Once | INTRAMUSCULAR | Status: AC
  Administered 2012-07-14: 4 mg via INTRAVENOUS
  Filled 2012-07-14: qty 2

## 2012-07-14 MED ORDER — ALBUTEROL SULFATE HFA 108 (90 BASE) MCG/ACT IN AERS: 2.0000 | INHALATION_SPRAY | RESPIRATORY_TRACT | Status: DC | PRN

## 2012-07-14 MED ORDER — SODIUM CHLORIDE 0.9 % IV SOLN
Freq: Once | INTRAVENOUS | Status: AC
  Administered 2012-07-14: 13:00:00 via INTRAVENOUS

## 2012-07-14 MED ORDER — LEVOFLOXACIN 500 MG PO TABS: 500.0000 mg | ORAL_TABLET | Freq: Every day | ORAL | Status: DC

## 2012-07-14 MED ORDER — LEVOFLOXACIN IN D5W 500 MG/100ML IV SOLN
500.0000 mg | Freq: Once | INTRAVENOUS | Status: AC
  Administered 2012-07-14: 500 mg via INTRAVENOUS
  Filled 2012-07-14: qty 100

## 2012-07-14 MED ORDER — ALBUTEROL SULFATE (5 MG/ML) 0.5% IN NEBU
5.0000 mg | INHALATION_SOLUTION | Freq: Once | RESPIRATORY_TRACT | Status: AC
  Administered 2012-07-14: 5 mg via RESPIRATORY_TRACT
  Filled 2012-07-14: qty 1

## 2012-07-14 NOTE — ED Provider Notes (Signed)
History/physical exam/procedure(s) were performed by non-physician practitioner and as supervising physician I was immediately available for consultation/collaboration. I have reviewed all notes and am in agreement with care and plan.   Hilario Quarry, MD 07/14/12 [phone]

## 2012-07-14 NOTE — ED Notes (Signed)
Chest congestion, cough, headache, no relief with z pak and hydrocodone for recent dx of pneumonia.

## 2012-07-14 NOTE — ED Notes (Signed)
Pt called and sts cough med tussionex is not covered by insurance. PA Sofia approved for pt to get script Hycodan 5ml po Q4h, total. precription called into [facility] [place] [phone]

## 2012-07-14 NOTE — ED Provider Notes (Signed)
History     CSN: [id number]  Arrival date & time 07/14/12  1142   First MD Initiated Contact with Patient 07/14/12 1210      Chief Complaint  Patient presents with  . Pneumonia    (Consider location/radiation/quality/duration/timing/severity/associated sxs/prior treatment) Patient is a 42 y.o. male presenting with cough. The history is provided by the patient. No language interpreter was used.  Cough Cough characteristics:  Productive Sputum characteristics:  Nondescript Severity:  Moderate Onset quality:  Gradual Progression:  Worsening Smoker: no   Relieved by:  Nothing Worsened by:  Nothing tried Ineffective treatments:  None tried Associated symptoms: fever and headaches    Pt report she was seen here 2 days ago and diagnosed with pneumonia.  Pt reports he is feeling worse.  Pt complains of a persist headache.  Pt reports cough is worse.  Pt reports he feels worse than he did orgininally Past Medical History  Diagnosis Date  . Anxiety and depression   . Reflux     Past Surgical History  Procedure Laterality Date  . Ankle surgery    . Foot surgery      History reviewed. No pertinent family history.  History  Substance Use Topics  . Smoking status: Former Games developer  . Smokeless tobacco: Never Used  . Alcohol Use: No      Review of Systems  Constitutional: Positive for fever.  Respiratory: Positive for cough.   Neurological: Positive for headaches.  All other systems reviewed and are negative.    Allergies  Review of patient's allergies indicates no known allergies.  Home Medications   Current Outpatient Rx  Name  Route  Sig  Dispense  Refill  . acetaminophen-codeine (TYLENOL #3) 300-30 MG per tablet   Oral   Take 1 tablet by mouth every 4 (four) hours as needed.         Marland Kitchen azithromycin (ZITHROMAX) 250 MG tablet   Oral   Take 1 tablet (250 mg total) by mouth daily. Take first 2 tablets together, then 1 every day until finished.   6 tablet    0   . cyclobenzaprine (FLEXERIL) 10 MG tablet   Oral   Take 1 tablet (10 mg total) by mouth 2 (two) times daily as needed for muscle spasms.   20 tablet   0   . HYDROcodone-acetaminophen (NORCO) 5-325 MG per tablet   Oral   Take 2 tablets by mouth every 4 (four) hours as needed for pain.   20 tablet   0   . HYDROcodone-acetaminophen (NORCO/VICODIN) 5-325 MG per tablet   Oral   Take 2 tablets by mouth every 4 (four) hours as needed for pain.   15 tablet   0   . HYDROcodone-acetaminophen (NORCO/VICODIN) 5-325 MG per tablet   Oral   Take 2 tablets by mouth every 4 (four) hours as needed for pain.   16 tablet   0   . hydrOXYzine (ATARAX/VISTARIL) 25 MG tablet   Oral   Take 25 mg by mouth 3 (three) times daily as needed.         Marland Kitchen ibuprofen (ADVIL,MOTRIN) 800 MG tablet   Oral   Take 1 tablet (800 mg total) by mouth 3 (three) times daily.   21 tablet   0   . LORazepam (ATIVAN) 1 MG tablet   Oral   Take 2-3 mg by mouth 2 (two) times daily between meals as needed. 2 during the day if needed and 3 at night for anxiety         .  meloxicam (MOBIC) 7.5 MG tablet   Oral   Take 1 tablet (7.5 mg total) by mouth daily.   7 tablet   0   . methocarbamol (ROBAXIN) 500 MG tablet   Oral   Take 1 tablet (500 mg total) by mouth 2 (two) times daily.   20 tablet   0   . mirtazapine (REMERON) 15 MG tablet   Oral   Take 7.5 mg by mouth at bedtime.         . nabumetone (RELAFEN) 500 MG tablet   Oral   Take 500 mg by mouth daily.         . nabumetone (RELAFEN) 500 MG tablet   Oral   Take 500 mg by mouth 2 (two) times daily.         Marland Kitchen OVER THE COUNTER MEDICATION      [employer] 1 scoop daily         . oxyCODONE (ROXICODONE) 15 MG immediate release tablet   Oral   Take 15 mg by mouth every 4 (four) hours as needed. For pain         . oxyCODONE-acetaminophen (PERCOCET/ROXICET) 5-325 MG per tablet   Oral   Take 1 tablet by mouth every 4 (four) hours as  needed for pain.   20 tablet   0   . traMADol (ULTRAM) 50 MG tablet   Oral   Take 1 tablet (50 mg total) by mouth every 6 (six) hours as needed for pain.   15 tablet   0   . traZODone (DESYREL) 50 MG tablet   Oral   Take 50 mg by mouth at bedtime.           BP 119/85  Pulse 111  Temp(Src) 98.9 F (37.2 C) (Oral)  Resp 22  Wt 220 lb (99.791 kg)  BMI 34.45 kg/m2  SpO2 98%  Physical Exam  Constitutional: He appears well-developed and well-nourished.  HENT:  Head: Normocephalic.  Right Ear: External ear normal.  Left Ear: External ear normal.  Nose: Nose normal.  Mouth/Throat: Oropharynx is clear and moist.  Eyes: Conjunctivae and EOM are normal. Pupils are equal, round, and reactive to light.  Neck: Normal range of motion. Neck supple.  Cardiovascular: Normal rate and normal heart sounds.   Pulmonary/Chest: Effort normal.  Abdominal: Soft.  Musculoskeletal: Normal range of motion.  Neurological: He is alert.  Skin: Skin is warm.  Psychiatric: He has a normal mood and affect.    ED Course  Procedures (including critical care time)  Labs Reviewed - No data to display Dg Chest 2 View  07/14/2012   *RADIOLOGY REPORT*  Clinical Data: Pneumonia  CHEST - 2 VIEW  Comparison: 07/11/2012  Findings: Cardiomediastinal silhouette is stable.  Worsening streaky infiltrate in the left lower lobe posteriorly.  No pulmonary edema.  S-shaped thoracolumbar scoliosis again noted.  IMPRESSION: Worsening streaky infiltrate in the left lower lobe posteriorly.   Original Report Authenticated By: Natasha Mead, M.D.   Results for orders placed during the hospital encounter of 07/14/12  CBC WITH DIFFERENTIAL      Result Value Range   WBC 9.1  4.0 - 10.5 K/uL   RBC 4.29  4.22 - 5.81 MIL/uL   Hemoglobin 13.1  13.0 - 17.0 g/dL   HCT 04.5 (*) 40.9 - 81.1 %   MCV 88.6  78.0 - 100.0 fL   MCH 30.5  26.0 - 34.0 pg   MCHC 34.5  30.0 - 36.0 g/dL  RDW 13.1  11.5 - 15.5 %   Platelets 289  150 -  400 K/uL   Neutrophils Relative % 59  43 - 77 %   Neutro Abs 5.4  1.7 - 7.7 K/uL   Lymphocytes Relative 26  12 - 46 %   Lymphs Abs 2.3  0.7 - 4.0 K/uL   Monocytes Relative 12  3 - 12 %   Monocytes Absolute 1.1 (*) 0.1 - 1.0 K/uL   Eosinophils Relative 3  0 - 5 %   Eosinophils Absolute 0.2  0.0 - 0.7 K/uL   Basophils Relative 0  0 - 1 %   Basophils Absolute 0.0  0.0 - 0.1 K/uL  COMPREHENSIVE METABOLIC PANEL      Result Value Range   Sodium 138  135 - 145 mEq/L   Potassium 3.9  3.5 - 5.1 mEq/L   Chloride 100  96 - 112 mEq/L   CO2 29  19 - 32 mEq/L   Glucose, Bld 91  70 - 99 mg/dL   BUN 8  6 - 23 mg/dL   Creatinine, Ser 2.95  0.50 - 1.35 mg/dL   Calcium 9.9  8.4 - 62.1 mg/dL   Total Protein 7.8  6.0 - 8.3 g/dL   Albumin 3.9  3.5 - 5.2 g/dL   AST 48 (*) 0 - 37 U/L   ALT 34  0 - 53 U/L   Alkaline Phosphatase 72  39 - 117 U/L   Total Bilirubin 0.3  0.3 - 1.2 mg/dL   GFR calc non Af Amer >90  >90 mL/min   GFR calc Af Amer >90  >90 mL/min   Dg Chest 2 View  07/14/2012   *RADIOLOGY REPORT*  Clinical Data: Pneumonia  CHEST - 2 VIEW  Comparison: 07/11/2012  Findings: Cardiomediastinal silhouette is stable.  Worsening streaky infiltrate in the left lower lobe posteriorly.  No pulmonary edema.  S-shaped thoracolumbar scoliosis again noted.  IMPRESSION: Worsening streaky infiltrate in the left lower lobe posteriorly.   Original Report Authenticated By: Natasha Mead, M.D.   Dg Chest 2 View  07/11/2012   *RADIOLOGY REPORT*  Clinical Data: Cough.  Body aches.  Sore throat.  CHEST - 2 VIEW  Comparison: 01/22/2012.  Findings: Retrocardiac airspace opacity is present localizing to the left lower lobe on the lateral view.  Findings are compatible with pneumonia. Follow-up to ensure radiographic clearing recommended.  Clearing is usually observed at 8 weeks.  The right lung appears clear.  Cardiopericardial silhouette is within normal limits.  S-shaped thoracic scoliosis.  IMPRESSION: Left lower lobe  pneumonia.   Original Report Authenticated By: Andreas Newport, M.D.     1. Community acquired pneumonia       MDM  PORT score Risk class 1.   Pt given IV levaquin here and IV fluids.  Pt given rx for levaquin, albuterol inhaler and tussionex.   Pt advised to see his MD for recheck in 2 days    Pt eating and drinking,  Looks good    Elson Areas, PA-C 07/14/12 1453  Lonia Skinner [place], [place] 07/14/12 1454

## 2012-10-19 ENCOUNTER — Ambulatory Visit (INDEPENDENT_AMBULATORY_CARE_PROVIDER_SITE_OTHER): Payer: Medicaid Other | Admitting: Podiatry

## 2012-10-19 ENCOUNTER — Encounter: Payer: Self-pay | Admitting: Podiatry

## 2012-10-19 DIAGNOSIS — M25572 Pain in left ankle and joints of left foot: Secondary | ICD-10-CM

## 2012-10-19 DIAGNOSIS — M25579 Pain in unspecified ankle and joints of unspecified foot: Secondary | ICD-10-CM | POA: Insufficient documentation

## 2012-10-19 DIAGNOSIS — M7750 Other enthesopathy of unspecified foot: Secondary | ICD-10-CM | POA: Insufficient documentation

## 2012-10-19 DIAGNOSIS — M898X9 Other specified disorders of bone, unspecified site: Secondary | ICD-10-CM

## 2012-10-19 HISTORY — DX: Pain in unspecified ankle and joints of unspecified foot: M25.579

## 2012-10-19 MED ORDER — OXYCODONE-ACETAMINOPHEN 10-325 MG PO TABS: 1.0000 | ORAL_TABLET | Freq: Three times a day (TID) | ORAL | Status: DC | PRN

## 2012-10-19 NOTE — Progress Notes (Addendum)
If on feet for a while, the foot pops at the back of left malleoli and hurts. Stated that this pain was present since the last surgery, removal of STJ implant left foot.  Pain Also on top dorsum of left foot x 2 weeks. Stated that he took all office notes to orthopedic doctor for second opinion, but the doctors did not wish to see him.  Patient seen primary care for pain and being discussed pain management.  Objective: No edema or erythema on affected area. Tender to palpate at postero-lateral aspect of the left malleoli along the course of Peroneus tendon.  Hypermobile medial column on left is present.  No acute skin lesions noted.  Assessment: Peroneal tendonitis along the course at posterior lateral ankle left with visible edema or erythema, but tender to palpate. Tenosynovitis midfoot left. Status post Lapidus fusion left.  Plan: Reviewed clinical findings. Advised that the tendon can be repaired by a different surgeon who is familiar with ankle surgery. Advised ankle support, elastic and orthotics. Rx. Percocet 10/325 per request.   Adended 01/07/2013 Correction in Assessment. The first line should state as "Peroneal tendonitis along the course at posterior lateral ankle left with no visible edema or erythema, but tender to palpate.

## 2012-11-15 ENCOUNTER — Encounter: Payer: Self-pay | Admitting: Podiatry

## 2012-11-15 ENCOUNTER — Ambulatory Visit (INDEPENDENT_AMBULATORY_CARE_PROVIDER_SITE_OTHER): Payer: Medicaid Other | Admitting: Podiatry

## 2012-11-15 VITALS — BP 133/90 | HR 73 | Ht 67.0 in | Wt 212.0 lb

## 2012-11-15 DIAGNOSIS — M7750 Other enthesopathy of unspecified foot: Secondary | ICD-10-CM

## 2012-11-15 DIAGNOSIS — M25579 Pain in unspecified ankle and joints of unspecified foot: Secondary | ICD-10-CM

## 2012-11-15 DIAGNOSIS — M898X9 Other specified disorders of bone, unspecified site: Secondary | ICD-10-CM

## 2012-11-15 DIAGNOSIS — M25572 Pain in left ankle and joints of left foot: Secondary | ICD-10-CM

## 2012-11-15 MED ORDER — NABUMETONE 500 MG PO TABS: 500.0000 mg | ORAL_TABLET | Freq: Two times a day (BID) | ORAL | Status: DC

## 2012-11-15 MED ORDER — OXYCODONE-ACETAMINOPHEN 10-325 MG PO TABS: 1.0000 | ORAL_TABLET | Freq: Three times a day (TID) | ORAL | Status: DC | PRN

## 2012-11-15 NOTE — Progress Notes (Signed)
Subjective: Patient is here requesting prescription for pain and anti-inflamatory medication. He was able to get referral to pain management and the appointment is in November 25, 2012. He wants pain medication prescription to last until then. Everything else is the same.  Assessment Chronic foot pain left.  Plan: Refer to pain management. Patient has appointment this month (11/25/12). Rx written to last till then.

## 2012-11-15 NOTE — Patient Instructions (Addendum)
Seen for pain on left foot.  Has an appointment with Pain management. Need prescription to last then.  Both Oxycodone and Relafen were prescribed.  Return as needed.

## 2013-01-10 ENCOUNTER — Encounter: Payer: Self-pay | Admitting: Podiatry

## 2013-01-10 ENCOUNTER — Ambulatory Visit (INDEPENDENT_AMBULATORY_CARE_PROVIDER_SITE_OTHER): Payer: Medicaid Other | Admitting: Podiatry

## 2013-01-10 DIAGNOSIS — M25579 Pain in unspecified ankle and joints of unspecified foot: Secondary | ICD-10-CM

## 2013-01-10 DIAGNOSIS — M7750 Other enthesopathy of unspecified foot: Secondary | ICD-10-CM

## 2013-01-10 DIAGNOSIS — M79609 Pain in unspecified limb: Secondary | ICD-10-CM

## 2013-01-10 DIAGNOSIS — M216X9 Other acquired deformities of unspecified foot: Secondary | ICD-10-CM

## 2013-01-10 NOTE — Progress Notes (Signed)
Patient presents wearing work boot like shoe without cane today. Patient requesting disability papers filled out.  Stated that he is still having the same ankle pain and unable to work.  Patient points medial and lateral ankle area at lateral CCJ and medial TNJ bilateral. Pain goes to ankle and up to lower limbs bilateral. The pain is getting worse. Stated that pain medication made him depressed.  Stated that past surgeries has not helped improving the foot pain.

## 2013-01-10 NOTE — Patient Instructions (Signed)
Filled out disability paper.

## 2013-01-16 ENCOUNTER — Emergency Department (HOSPITAL_BASED_OUTPATIENT_CLINIC_OR_DEPARTMENT_OTHER): Payer: Medicaid Other

## 2013-01-16 ENCOUNTER — Emergency Department (HOSPITAL_BASED_OUTPATIENT_CLINIC_OR_DEPARTMENT_OTHER)
Admission: EM | Admit: 2013-01-16 | Discharge: 2013-01-16 | Disposition: A | Discharge status: Home / Self Care | Payer: Medicaid Other | Attending: Emergency Medicine | Admitting: Emergency Medicine

## 2013-01-16 ENCOUNTER — Encounter (HOSPITAL_BASED_OUTPATIENT_CLINIC_OR_DEPARTMENT_OTHER): Payer: Self-pay | Admitting: Emergency Medicine

## 2013-01-16 DIAGNOSIS — R071 Chest pain on breathing: Secondary | ICD-10-CM | POA: Insufficient documentation

## 2013-01-16 DIAGNOSIS — Z79899 Other long term (current) drug therapy: Secondary | ICD-10-CM | POA: Insufficient documentation

## 2013-01-16 DIAGNOSIS — Z8739 Personal history of other diseases of the musculoskeletal system and connective tissue: Secondary | ICD-10-CM | POA: Insufficient documentation

## 2013-01-16 DIAGNOSIS — Z8659 Personal history of other mental and behavioral disorders: Secondary | ICD-10-CM | POA: Insufficient documentation

## 2013-01-16 DIAGNOSIS — R0789 Other chest pain: Secondary | ICD-10-CM

## 2013-01-16 DIAGNOSIS — Z87891 Personal history of nicotine dependence: Secondary | ICD-10-CM | POA: Insufficient documentation

## 2013-01-16 DIAGNOSIS — J9801 Acute bronchospasm: Secondary | ICD-10-CM | POA: Insufficient documentation

## 2013-01-16 DIAGNOSIS — Z8719 Personal history of other diseases of the digestive system: Secondary | ICD-10-CM | POA: Insufficient documentation

## 2013-01-16 LAB — CBC WITH DIFFERENTIAL/PLATELET
Basophils Absolute: 0 10*3/uL (ref 0.0–0.1)
Basophils Relative: 0 % (ref 0–1)
Eosinophils Absolute: 0.1 10*3/uL (ref 0.0–0.7)
Eosinophils Relative: 1 % (ref 0–5)
Lymphocytes Relative: 50 % — ABNORMAL HIGH (ref 12–46)
Lymphs Abs: 4.3 10*3/uL — ABNORMAL HIGH (ref 0.7–4.0)
MCH: 29.9 pg (ref 26.0–34.0)
MCHC: 33.5 g/dL (ref 30.0–36.0)
MCV: 89.2 fL (ref 78.0–100.0)
Neutrophils Relative %: 39 % — ABNORMAL LOW (ref 43–77)
Platelets: 296 10*3/uL (ref 150–400)
RBC: 4.15 MIL/uL — ABNORMAL LOW (ref 4.22–5.81)
RDW: 12.8 % (ref 11.5–15.5)

## 2013-01-16 LAB — BASIC METABOLIC PANEL
BUN: 11 mg/dL (ref 6–23)
Calcium: 9.8 mg/dL (ref 8.4–10.5)
Chloride: 102 mEq/L (ref 96–112)
GFR calc Af Amer: >90 mL/min (ref >90)
GFR calc non Af Amer: >90 mL/min (ref >90)
Glucose, Bld: 99 mg/dL (ref 70–99)
Sodium: 139 mEq/L (ref 135–145)

## 2013-01-16 LAB — PRO B NATRIURETIC PEPTIDE: Pro B Natriuretic peptide (BNP): 24.9 pg/mL (ref 0–125)

## 2013-01-16 MED ORDER — IPRATROPIUM BROMIDE 0.02 % IN SOLN
0.5000 mg | Freq: Once | RESPIRATORY_TRACT | Status: AC
  Administered 2013-01-16: 0.5 mg via RESPIRATORY_TRACT
  Filled 2013-01-16: qty 2.5

## 2013-01-16 MED ORDER — LEVALBUTEROL TARTRATE 45 MCG/ACT IN AERO: 2.0000 | INHALATION_SPRAY | RESPIRATORY_TRACT | Status: DC | PRN

## 2013-01-16 MED ORDER — METHYLPREDNISOLONE 4 MG PO KIT: PACK | ORAL | Status: DC

## 2013-01-16 MED ORDER — LEVALBUTEROL HCL 0.63 MG/3ML IN NEBU
1.2500 mg | INHALATION_SOLUTION | Freq: Once | RESPIRATORY_TRACT | Status: AC
  Administered 2013-01-16: 1.25 mg via RESPIRATORY_TRACT
  Filled 2013-01-16: qty 6

## 2013-01-16 MED ORDER — KETOROLAC TROMETHAMINE 30 MG/ML IJ SOLN
30.0000 mg | Freq: Once | INTRAMUSCULAR | Status: AC
  Administered 2013-01-16: 30 mg via INTRAVENOUS
  Filled 2013-01-16: qty 1

## 2013-01-16 NOTE — ED Notes (Signed)
Pt states "I can't breathe" c/o feeling jittery - pt states he has felt this way for the last few hours. Pt reports left sided chest pain.

## 2013-01-16 NOTE — ED Provider Notes (Signed)
CSN: [id number]     Arrival date & time 01/16/13  0604 History   First MD Initiated Contact with Patient 01/16/13 [phone]     Chief Complaint  Patient presents with  . Shortness of Breath   (Consider location/radiation/quality/duration/timing/severity/associated sxs/prior Treatment) HPI This is a 42 year old male with shortness of breath and left-sided chest wall pain that began earlier this morning. He is vague about the exact timing. Shortness of breath as described is difficulty taking a deep breath as well as pain when taking a deep breath. The pain is located in his left lateral lower rib area and is worse with breathing or palpation. He denies nausea or diaphoresis. He denies pain or swelling in his legs. He denies recent surgery or travel.   Past Medical History  Diagnosis Date  . Anxiety and depression   . Reflux   . Pain in joint, ankle and foot 10/19/2012   Past Surgical History  Procedure Laterality Date  . Ankle surgery    . Foot surgery     No family history on file. History  Substance Use Topics  . Smoking status: Former Games developer  . Smokeless tobacco: Never Used  . Alcohol Use: No    Review of Systems  All other systems reviewed and are negative.    Allergies  Review of patient's allergies indicates no known allergies.  Home Medications   Current Outpatient Rx  Name  Route  Sig  Dispense  Refill  . oxyCODONE-acetaminophen (PERCOCET) 10-325 MG per tablet   Oral   Take 1 tablet by mouth every 8 (eight) hours as needed for pain.   45 tablet   0   . nabumetone (RELAFEN) 500 MG tablet   Oral   Take 1 tablet (500 mg total) by mouth 2 (two) times daily.   60 tablet   1    BP 133/90  Pulse 72  Temp(Src) 97.9 F (36.6 C) (Oral)  Resp 20  SpO2 96%  Physical Exam General: Well-developed, well-nourished male in no acute distress; appearance consistent with age of record HENT: normocephalic; atraumatic Eyes: pupils equal, round and reactive to light;  extraocular muscles intact Neck: supple Heart: regular rate and rhythm; no murmurs, rubs or gallops Lungs: Decreased air movement bilaterally Chest: Left lateral lower chest wall tenderness without rash or crepitus Abdomen: soft; nondistended; nontender; bowel sounds present Extremities: No deformity; full range of motion; pulses normal; no edema Neurologic: Awake, alert; motor function intact in all extremities and symmetric; no facial droop Skin: Warm and dry Psychiatric: Anxious; flat affect    ED Course  Procedures (including critical care time)   EKG Interpretation     Ventricular Rate:  62 PR Interval:  124 QRS Duration: 98 QT Interval:  418 QTC Calculation: 424 R Axis:   31 Text Interpretation:  Normal sinus rhythm T wave abnormality, consider inferior ischemia T wave abnormality, consider anterolateral ischemia No significant change was found      MDM   Nursing notes and vitals signs, including pulse oximetry, reviewed.  Summary of this visit's results, reviewed by myself:  Labs:  Results for orders placed during the hospital encounter of 01/16/13 (from the past 24 hour(s))  CBC WITH DIFFERENTIAL     Status: Abnormal   Collection Time    01/16/13  6:26 AM      Result Value Range   WBC 8.7  4.0 - 10.5 K/uL   RBC 4.15 (*) 4.22 - 5.81 MIL/uL   Hemoglobin 12.4 (*) 13.0 -  17.0 g/dL   HCT 84.6 (*) 96.2 - 95.2 %   MCV 89.2  78.0 - 100.0 fL   MCH 29.9  26.0 - 34.0 pg   MCHC 33.5  30.0 - 36.0 g/dL   RDW 84.1  32.4 - 40.1 %   Platelets 296  150 - 400 K/uL   Neutrophils Relative % 39 (*) 43 - 77 %   Neutro Abs 3.4  1.7 - 7.7 K/uL   Lymphocytes Relative 50 (*) 12 - 46 %   Lymphs Abs 4.3 (*) 0.7 - 4.0 K/uL   Monocytes Relative 10  3 - 12 %   Monocytes Absolute 0.9  0.1 - 1.0 K/uL   Eosinophils Relative 1  0 - 5 %   Eosinophils Absolute 0.1  0.0 - 0.7 K/uL   Basophils Relative 0  0 - 1 %   Basophils Absolute 0.0  0.0 - 0.1 K/uL  BASIC METABOLIC PANEL     Status:  None   Collection Time    01/16/13  6:26 AM      Result Value Range   Sodium 139  135 - 145 mEq/L   Potassium 4.1  3.5 - 5.1 mEq/L   Chloride 102  96 - 112 mEq/L   CO2 30  19 - 32 mEq/L   Glucose, Bld 99  70 - 99 mg/dL   BUN 11  6 - 23 mg/dL   Creatinine, Ser 0.27  0.50 - 1.35 mg/dL   Calcium 9.8  8.4 - 25.3 mg/dL   GFR calc non Af Amer >90  >90 mL/min   GFR calc Af Amer >90  >90 mL/min  D-DIMER, QUANTITATIVE     Status: None   Collection Time    01/16/13  6:26 AM      Result Value Range   D-Dimer, Quant <0.27  0.00 - 0.48 ug/mL-FEU  PRO B NATRIURETIC PEPTIDE     Status: None   Collection Time    01/16/13  6:26 AM      Result Value Range   Pro B Natriuretic peptide (BNP) 24.9  0 - 125 pg/mL  TROPONIN I     Status: None   Collection Time    01/16/13  6:26 AM      Result Value Range   Troponin I <0.30  <0.30 ng/mL    Imaging Studies: Dg Chest 2 View  01/16/2013   CLINICAL DATA:  Chest pain; anxiety.  EXAM: CHEST  2 VIEW  COMPARISON:  Chest radiograph performed 07/14/2012  FINDINGS: The lungs are well-aerated and clear. There is no evidence of focal opacification, pleural effusion or pneumothorax.  The heart is normal in size; the mediastinal contour is within normal limits. No acute osseous abnormalities are seen. There is deformity involving the right posterolateral seventh rib; this appears to be chronic, though new from the prior study. Mild right convex thoracic scoliosis is noted.  IMPRESSION: 1. No acute cardiopulmonary process seen. 2. Deformity involving the right posterolateral seventh rib. This appears to be chronic, though new from the prior study.   Electronically Signed   By: Roanna Raider M.D.   On: 01/16/2013 06:37   7:04 AM Air movement improved after Xopenex and Atrovent neb treatment. We'll place him on Xopenex HFA (patient does not tolerate albuterol well) and steroid taper.       Hanley Seamen, MD 01/16/13 [phone]

## 2013-04-05 DIAGNOSIS — M79609 Pain in unspecified limb: Secondary | ICD-10-CM

## 2013-05-29 ENCOUNTER — Encounter (HOSPITAL_BASED_OUTPATIENT_CLINIC_OR_DEPARTMENT_OTHER): Payer: Self-pay | Admitting: Emergency Medicine

## 2013-05-29 ENCOUNTER — Emergency Department (HOSPITAL_BASED_OUTPATIENT_CLINIC_OR_DEPARTMENT_OTHER)
Admission: EM | Admit: 2013-05-29 | Discharge: 2013-05-29 | Disposition: A | Discharge status: Home / Self Care | Payer: Medicaid Other | Attending: Emergency Medicine | Admitting: Emergency Medicine

## 2013-05-29 ENCOUNTER — Emergency Department (HOSPITAL_BASED_OUTPATIENT_CLINIC_OR_DEPARTMENT_OTHER): Payer: Medicaid Other

## 2013-05-29 DIAGNOSIS — Z8679 Personal history of other diseases of the circulatory system: Secondary | ICD-10-CM | POA: Insufficient documentation

## 2013-05-29 DIAGNOSIS — M6281 Muscle weakness (generalized): Secondary | ICD-10-CM | POA: Insufficient documentation

## 2013-05-29 DIAGNOSIS — Z87891 Personal history of nicotine dependence: Secondary | ICD-10-CM | POA: Insufficient documentation

## 2013-05-29 DIAGNOSIS — Z8659 Personal history of other mental and behavioral disorders: Secondary | ICD-10-CM | POA: Insufficient documentation

## 2013-05-29 DIAGNOSIS — R110 Nausea: Secondary | ICD-10-CM | POA: Insufficient documentation

## 2013-05-29 DIAGNOSIS — Z791 Long term (current) use of non-steroidal anti-inflammatories (NSAID): Secondary | ICD-10-CM | POA: Insufficient documentation

## 2013-05-29 DIAGNOSIS — R51 Headache: Secondary | ICD-10-CM | POA: Insufficient documentation

## 2013-05-29 DIAGNOSIS — R059 Cough, unspecified: Secondary | ICD-10-CM | POA: Insufficient documentation

## 2013-05-29 DIAGNOSIS — Z8739 Personal history of other diseases of the musculoskeletal system and connective tissue: Secondary | ICD-10-CM | POA: Insufficient documentation

## 2013-05-29 DIAGNOSIS — R05 Cough: Secondary | ICD-10-CM | POA: Insufficient documentation

## 2013-05-29 DIAGNOSIS — H5501 Congenital nystagmus: Secondary | ICD-10-CM | POA: Insufficient documentation

## 2013-05-29 DIAGNOSIS — R519 Headache, unspecified: Secondary | ICD-10-CM

## 2013-05-29 DIAGNOSIS — Z8719 Personal history of other diseases of the digestive system: Secondary | ICD-10-CM | POA: Insufficient documentation

## 2013-05-29 HISTORY — DX: Migraine, unspecified, not intractable, without status migrainosus: G43.909

## 2013-05-29 MED ORDER — PROMETHAZINE HCL 25 MG PO TABS: 25.0000 mg | ORAL_TABLET | Freq: Four times a day (QID) | ORAL | Status: DC | PRN

## 2013-05-29 MED ORDER — METOCLOPRAMIDE HCL 5 MG/ML IJ SOLN
10.0000 mg | Freq: Once | INTRAMUSCULAR | Status: AC
  Administered 2013-05-29: 10 mg via INTRAMUSCULAR
  Filled 2013-05-29: qty 2

## 2013-05-29 MED ORDER — KETOROLAC TROMETHAMINE 60 MG/2ML IM SOLN
60.0000 mg | Freq: Once | INTRAMUSCULAR | Status: AC
  Administered 2013-05-29: 60 mg via INTRAMUSCULAR
  Filled 2013-05-29: qty 2

## 2013-05-29 MED ORDER — IBUPROFEN 800 MG PO TABS: 800.0000 mg | ORAL_TABLET | Freq: Three times a day (TID) | ORAL | Status: DC

## 2013-05-29 MED ORDER — DEXAMETHASONE SODIUM PHOSPHATE 10 MG/ML IJ SOLN
10.0000 mg | Freq: Once | INTRAMUSCULAR | Status: AC
  Administered 2013-05-29: 10 mg via INTRAMUSCULAR
  Filled 2013-05-29: qty 1

## 2013-05-29 MED ORDER — PROMETHAZINE HCL 25 MG RE SUPP: 25.0000 mg | Freq: Four times a day (QID) | RECTAL | Status: DC | PRN

## 2013-05-29 NOTE — ED Notes (Signed)
Pt having intermittent right sided headache since Tuesday.  Pt states pain is sharp, causes nausea and left sided weakness.  Noted nystagmus during triage.  No fever or cold symptoms currently.  Some light sensitivity.  Not like his typical migraine.

## 2013-05-29 NOTE — ED Provider Notes (Signed)
CSN: [id number]     Arrival date & time 05/29/13  4098 History   This chart was scribed for Carlos Crease, MD, by Yevette Edwards, ED Scribe. This patient was seen in room MH10/MH10 and the patient's care was started at 6:39 PM. First MD Initiated Contact with Patient 05/29/13 1838     Chief Complaint  Patient presents with  . Headache    The history is provided by the patient. No language interpreter was used.   HPI Comments: Carlos Christensen is a 43 y.o. Male, with a h/o migraines, who presents to the Emergency Department complaining of an intermittent right-sided headache which began five days ago. He states the duration of the headaches areapproximately 5-10 minutes, though yesterday he had an episode that last approximately 20 minutes. He describes the pain as "sharp," and he states the pain has gradually worsened.  The pt has treated the most recent headache with one oxycodone. He has experienced nausea and left-sided weakness as associated symptoms. The weakness occurs at the onset of his headaches; the weakness does not occur when not in conjunction with the headaches. He has congential nystagmus. He denies LOC, fever, and cold symptoms. His last migraine was approximately three years ago. The pt is treated at a pain management clinic and uses oxycodone for pain associated with previous foot surgeries.   Past Medical History  Diagnosis Date  . Anxiety and depression   . Reflux   . Pain in joint, ankle and foot 10/19/2012  . Migraine    Past Surgical History  Procedure Laterality Date  . Ankle surgery    . Foot surgery     No family history on file. History  Substance Use Topics  . Smoking status: Former Games developer  . Smokeless tobacco: Never Used  . Alcohol Use: No    Review of Systems  Constitutional: Negative for fever.  HENT: Negative for congestion, rhinorrhea and sneezing.   Respiratory: Positive for cough.   Gastrointestinal: Positive for nausea.  Neurological:  Positive for weakness and headaches. Negative for syncope.  All other systems reviewed and are negative.    Allergies  Review of patient's allergies indicates no known allergies.  Home Medications   Current Outpatient Rx  Name  Route  Sig  Dispense  Refill  . meloxicam (MOBIC) 15 MG tablet   Oral   Take 15 mg by mouth daily.         Marland Kitchen oxyCODONE-acetaminophen (PERCOCET) 10-325 MG per tablet   Oral   Take 1 tablet by mouth every 8 (eight) hours as needed for pain.   45 tablet   0   . ibuprofen (ADVIL,MOTRIN) 800 MG tablet   Oral   Take 1 tablet (800 mg total) by mouth 3 (three) times daily.   21 tablet   0   . promethazine (PHENERGAN) 25 MG suppository   Rectal   Place 1 suppository (25 mg total) rectally every 6 (six) hours as needed for nausea or vomiting.   12 each   0    Triage Vitals: BP 120/80  Pulse 66  Temp(Src) 98.4 F (36.9 C) (Oral)  Resp 16  Ht 5\' 7"  (1.702 m)  Wt 211 lb (95.709 kg)  BMI 33.04 kg/m2  SpO2 97%  Physical Exam  Constitutional: He is oriented to person, place, and time. He appears well-developed and well-nourished. No distress.  HENT:  Head: Normocephalic and atraumatic.  Right Ear: Hearing normal.  Left Ear: Hearing normal.  Nose: Nose normal.  Mouth/Throat: Oropharynx is clear and moist and mucous membranes are normal.  Eyes: Conjunctivae and EOM are normal. Pupils are equal, round, and reactive to light.  Nystagmus.   Neck: Normal range of motion. Neck supple.  Cardiovascular: Regular rhythm, S1 normal and S2 normal.  Exam reveals no gallop and no friction rub.   No murmur heard. Pulmonary/Chest: Effort normal and breath sounds normal. No respiratory distress. He exhibits no tenderness.  Abdominal: Soft. Normal appearance and bowel sounds are normal. There is no hepatosplenomegaly. There is no tenderness. There is no rebound, no guarding, no tenderness at McBurney's point and negative Murphy's sign. No hernia.  Musculoskeletal:  Normal range of motion.  Neurological: He is alert and oriented to person, place, and time. He has normal strength. No cranial nerve deficit or sensory deficit. Coordination normal. GCS eye subscore is 4. GCS verbal subscore is 5. GCS motor subscore is 6.  Skin: Skin is warm, dry and intact. No rash noted. No cyanosis.  Psychiatric: He has a normal mood and affect. His speech is normal and behavior is normal. Thought content normal.    ED Course  Procedures (including critical care time)  DIAGNOSTIC STUDIES: Oxygen Saturation is 97% on room air, normal by my interpretation.    COORDINATION OF CARE:  6:49 PM- Discussed treatment plan with patient, and the patient agreed to the plan. The plan includes a CT scan and a possible lumbar puncture.   Labs Review Labs Reviewed - No data to display Imaging Review Ct Head Wo Contrast  05/29/2013   CLINICAL DATA:  Headache.  EXAM: CT HEAD WITHOUT CONTRAST  TECHNIQUE: Contiguous axial images were obtained from the base of the skull through the vertex without intravenous contrast.  COMPARISON:  CT HEAD W/O CM dated 05/30/2009  FINDINGS: No mass lesion, mass effect, midline shift, hydrocephalus, hemorrhage. No territorial ischemia or acute infarction. Paranasal sinuses and mastoid air cells are clear. Scout images appear normal.  IMPRESSION: Negative CT head.   Electronically Signed   By: Andreas NewportGeoffrey  Lamke M.D.   On: 05/29/2013 19:11     EKG Interpretation None      MDM   Final diagnoses:  Headache    Patient presents to the ER for evaluation of recurrent headache. Patient has had intermittent episodes of right-sided headache over the past several days. He reports onset of a sharp pain in the right side of his head that last for approximately 15 minutes at a time. He reports that sometimes when the pain comes on he feels a weakness in his left leg briefly and then it goes away. He does have a history of migraine headaches, but this is  different.  Patient has normal neurologic function. No ataxia, no weakness, no sensory deficit. He is alert and oriented. Currently he has no headache. I did consider the possibility of intracranial cause for the headache including subarachnoid hemorrhage. This was discussed with him. A CT scan was performed and was negative. We did discuss the process of lumbar puncture as well, as a means to completely rule out subarachnoid hemorrhage. He was administered medications here in the ER and has not had any headache since he arrived. Based on the fact that he has no headache currently, normal neurologic function, I do not suspect a subarachnoid hemorrhage at this time. Would hold off on lumbar puncture, patient told to come back to the ER he has any persistent headache.  I personally performed the services described in this documentation, which was scribed  in my presence. The recorded information has been reviewed and is accurate.    Carlos Crease, MD 05/29/13 1950

## 2013-05-29 NOTE — Discharge Instructions (Signed)

## 2013-08-17 ENCOUNTER — Encounter (HOSPITAL_BASED_OUTPATIENT_CLINIC_OR_DEPARTMENT_OTHER): Payer: Self-pay | Admitting: Emergency Medicine

## 2013-08-17 ENCOUNTER — Emergency Department (HOSPITAL_BASED_OUTPATIENT_CLINIC_OR_DEPARTMENT_OTHER): Payer: Medicaid Other

## 2013-08-17 ENCOUNTER — Emergency Department (HOSPITAL_BASED_OUTPATIENT_CLINIC_OR_DEPARTMENT_OTHER)
Admission: EM | Admit: 2013-08-17 | Discharge: 2013-08-17 | Disposition: A | Discharge status: Home / Self Care | Payer: Medicaid Other | Attending: Emergency Medicine | Admitting: Emergency Medicine

## 2013-08-17 DIAGNOSIS — S93409A Sprain of unspecified ligament of unspecified ankle, initial encounter: Secondary | ICD-10-CM | POA: Insufficient documentation

## 2013-08-17 DIAGNOSIS — Z8659 Personal history of other mental and behavioral disorders: Secondary | ICD-10-CM | POA: Insufficient documentation

## 2013-08-17 DIAGNOSIS — Z87891 Personal history of nicotine dependence: Secondary | ICD-10-CM | POA: Insufficient documentation

## 2013-08-17 DIAGNOSIS — Z8719 Personal history of other diseases of the digestive system: Secondary | ICD-10-CM | POA: Insufficient documentation

## 2013-08-17 DIAGNOSIS — Z791 Long term (current) use of non-steroidal anti-inflammatories (NSAID): Secondary | ICD-10-CM | POA: Insufficient documentation

## 2013-08-17 DIAGNOSIS — Y93K1 Activity, walking an animal: Secondary | ICD-10-CM | POA: Insufficient documentation

## 2013-08-17 DIAGNOSIS — Y9289 Other specified places as the place of occurrence of the external cause: Secondary | ICD-10-CM | POA: Insufficient documentation

## 2013-08-17 DIAGNOSIS — X500XXA Overexertion from strenuous movement or load, initial encounter: Secondary | ICD-10-CM | POA: Insufficient documentation

## 2013-08-17 DIAGNOSIS — Z8679 Personal history of other diseases of the circulatory system: Secondary | ICD-10-CM | POA: Insufficient documentation

## 2013-08-17 DIAGNOSIS — S93402A Sprain of unspecified ligament of left ankle, initial encounter: Secondary | ICD-10-CM

## 2013-08-17 MED ORDER — IBUPROFEN 800 MG PO TABS: 800.0000 mg | ORAL_TABLET | Freq: Three times a day (TID) | ORAL | Status: DC

## 2013-08-17 NOTE — ED Notes (Signed)
Reports he was walking his dog last night and rolled left ankle when he stepped off a manhole- Has had surgery x 5 on same ankle- Using cane today due to pain

## 2013-08-17 NOTE — Discharge Instructions (Signed)
Continue your medications as before.  Rest.  Followup with your podiatrist if not improving in the next week.   Ankle Sprain An ankle sprain is an injury to the strong, fibrous tissues (ligaments) that hold the bones of your ankle joint together.  CAUSES An ankle sprain is usually caused by a fall or by twisting your ankle. Ankle sprains most commonly occur when you step on the outer edge of your foot, and your ankle turns inward. People who participate in sports are more prone to these types of injuries.  SYMPTOMS   Pain in your ankle. The pain may be present at rest or only when you are trying to stand or walk.  Swelling.  Bruising. Bruising may develop immediately or within 1 to 2 days after your injury.  Difficulty standing or walking, particularly when turning corners or changing directions. DIAGNOSIS  Your caregiver will ask you details about your injury and perform a physical exam of your ankle to determine if you have an ankle sprain. During the physical exam, your caregiver will press on and apply pressure to specific areas of your foot and ankle. Your caregiver will try to move your ankle in certain ways. An X-ray exam may be done to be sure a bone was not broken or a ligament did not separate from one of the bones in your ankle (avulsion fracture).  TREATMENT  Certain types of braces can help stabilize your ankle. Your caregiver can make a recommendation for this. Your caregiver may recommend the use of medicine for pain. If your sprain is severe, your caregiver may refer you to a surgeon who helps to restore function to parts of your skeletal system (orthopedist) or a physical therapist. HOME CARE INSTRUCTIONS   Apply ice to your injury for 1-2 days or as directed by your caregiver. Applying ice helps to reduce inflammation and pain.  Put ice in a plastic bag.  Place a towel between your skin and the bag.  Leave the ice on for 15-20 minutes at a time, every 2 hours while  you are awake.  Only take over-the-counter or prescription medicines for pain, discomfort, or fever as directed by your caregiver.  Elevate your injured ankle above the level of your heart as much as possible for 2-3 days.  If your caregiver recommends crutches, use them as instructed. Gradually put weight on the affected ankle. Continue to use crutches or a cane until you can walk without feeling pain in your ankle.  If you have a plaster splint, wear the splint as directed by your caregiver. Do not rest it on anything harder than a pillow for the first 24 hours. Do not put weight on it. Do not get it wet. You may take it off to take a shower or bath.  You may have been given an elastic bandage to wear around your ankle to provide support. If the elastic bandage is too tight (you have numbness or tingling in your foot or your foot becomes cold and blue), adjust the bandage to make it comfortable.  If you have an air splint, you may blow more air into it or let air out to make it more comfortable. You may take your splint off at night and before taking a shower or bath. Wiggle your toes in the splint several times per day to decrease swelling. SEEK MEDICAL CARE IF:   You have rapidly increasing bruising or swelling.  Your toes feel extremely cold or you lose feeling in your  foot.  Your pain is not relieved with medicine. SEEK IMMEDIATE MEDICAL CARE IF:  Your toes are numb or blue.  You have severe pain that is increasing. MAKE SURE YOU:   Understand these instructions.  Will watch your condition.  Will get help right away if you are not doing well or get worse. Document Released: 02/17/2005 Document Revised: 11/12/2011 Document Reviewed: 03/01/2011 [facility] Patient Information 2015 [place], [place]. This information is not intended to replace advice given to you by your health care provider. Make sure you discuss any questions you have with your health care provider.

## 2013-08-17 NOTE — ED Provider Notes (Signed)
CSN: [id number]     Arrival date & time 08/17/13  1536 History   First MD Initiated Contact with Patient 08/17/13 1546     Chief Complaint  Patient presents with  . Ankle Pain     (Consider location/radiation/quality/duration/timing/severity/associated sxs/prior Treatment) HPI Comments: Patient is a [age] year old male with history of ankle surgery x5 in the past. He presents today with complaints of inverting his ankle when he stepped off of a sidewalk while walking his dog last night. He complains of pain and difficulty ambulating.  Patient is a 43 y.o. male presenting with ankle pain. The history is provided by the patient.  Ankle Pain Location:  Ankle Time since incident:  12 hours Injury: yes   Ankle location:  L ankle Pain details:    Quality:  Sharp   Radiates to:  Does not radiate   Severity:  Moderate   Onset quality:  Sudden   Timing:  Constant   Progression:  Unchanged Chronicity:  New Dislocation: no   Prior injury to area:  Yes   Past Medical History  Diagnosis Date  . Anxiety and depression   . Reflux   . Pain in joint, ankle and foot 10/19/2012  . Migraine    Past Surgical History  Procedure Laterality Date  . Ankle surgery    . Foot surgery     No family history on file. History  Substance Use Topics  . Smoking status: Former Games developermoker  . Smokeless tobacco: Never Used  . Alcohol Use: No    Review of Systems  All other systems reviewed and are negative.     Allergies  Review of patient's allergies indicates no known allergies.  Home Medications   Prior to Admission medications   Medication Sig Start Date End Date Taking? Authorizing Provider  ibuprofen (ADVIL,MOTRIN) 800 MG tablet Take 1 tablet (800 mg total) by mouth 3 (three) times daily. 05/29/13  Yes Gilda Creasehristopher J. Pollina, MD  meloxicam (MOBIC) 15 MG tablet Take 15 mg by mouth daily.   Yes Historical Provider, MD  oxyCODONE-acetaminophen (PERCOCET) 10-325 MG per tablet Take 1 tablet by  mouth every 8 (eight) hours as needed for pain. 11/15/12  Yes Myeong Sheard, DPM  promethazine (PHENERGAN) 25 MG suppository Place 1 suppository (25 mg total) rectally every 6 (six) hours as needed for nausea or vomiting. 05/29/13  Yes Gilda Creasehristopher J. Pollina, MD  promethazine (PHENERGAN) 25 MG tablet Take 1 tablet (25 mg total) by mouth every 6 (six) hours as needed for nausea or vomiting. 05/29/13   Gilda Creasehristopher J. Pollina, MD   BP 124/81  Pulse 74  Temp(Src) 98.1 F (36.7 C) (Oral)  Resp 18  Ht 5\' 7"  (1.702 m)  Wt 219 lb (99.338 kg)  BMI 34.29 kg/m2  SpO2 98% Physical Exam  Nursing note and vitals reviewed. Constitutional: He is oriented to person, place, and time. He appears well-developed and well-nourished. No distress.  HENT:  Head: Normocephalic and atraumatic.  Neck: Normal range of motion. Neck supple.  Musculoskeletal:  The left ankle appears grossly normal. There is no significant swelling or ecchymosis. There is tenderness to palpation just inferior to the lateral malleolus. There is no medial malleolus tenderness, nor is there proximal fibular or fifth metatarsal tenderness to palpation. There is pain with range of motion, however the joint appears stable.  Neurological: He is alert and oriented to person, place, and time.  Skin: Skin is warm and dry. He is not diaphoretic.    ED Course  Procedures (including critical care time) Labs Review Labs Reviewed - No data to display  Imaging Review No results found.   EKG Interpretation None      MDM   Final diagnoses:  None    X-rays are negative for fracture. Patient will be discharged. He is to continue his oxycodone as needed for pain and followup with his podiatrist if not improving in the next week.    Geoffery Lyonsouglas Delo, MD 08/17/13 [phone]

## 2014-07-01 ENCOUNTER — Encounter (HOSPITAL_BASED_OUTPATIENT_CLINIC_OR_DEPARTMENT_OTHER): Payer: Self-pay | Admitting: Emergency Medicine

## 2014-07-01 ENCOUNTER — Emergency Department (HOSPITAL_BASED_OUTPATIENT_CLINIC_OR_DEPARTMENT_OTHER)
Admission: EM | Admit: 2014-07-01 | Discharge: 2014-07-01 | Disposition: A | Discharge status: Home / Self Care | Payer: Medicaid Other | Attending: Emergency Medicine | Admitting: Emergency Medicine

## 2014-07-01 DIAGNOSIS — M419 Scoliosis, unspecified: Secondary | ICD-10-CM | POA: Diagnosis not present

## 2014-07-01 DIAGNOSIS — Z8719 Personal history of other diseases of the digestive system: Secondary | ICD-10-CM | POA: Insufficient documentation

## 2014-07-01 DIAGNOSIS — M546 Pain in thoracic spine: Secondary | ICD-10-CM | POA: Diagnosis not present

## 2014-07-01 DIAGNOSIS — M545 Low back pain: Secondary | ICD-10-CM | POA: Diagnosis present

## 2014-07-01 DIAGNOSIS — Z8679 Personal history of other diseases of the circulatory system: Secondary | ICD-10-CM | POA: Diagnosis not present

## 2014-07-01 DIAGNOSIS — G8929 Other chronic pain: Secondary | ICD-10-CM | POA: Diagnosis not present

## 2014-07-01 DIAGNOSIS — Z8659 Personal history of other mental and behavioral disorders: Secondary | ICD-10-CM | POA: Diagnosis not present

## 2014-07-01 DIAGNOSIS — M5417 Radiculopathy, lumbosacral region: Secondary | ICD-10-CM | POA: Insufficient documentation

## 2014-07-01 HISTORY — DX: Scoliosis, unspecified: M41.9

## 2014-07-01 MED ORDER — IBUPROFEN 800 MG PO TABS
800.0000 mg | ORAL_TABLET | Freq: Once | ORAL | Status: AC
  Administered 2014-07-01: 800 mg via ORAL
  Filled 2014-07-01: qty 1

## 2014-07-01 MED ORDER — IBUPROFEN 600 MG PO TABS: 600.0000 mg | ORAL_TABLET | Freq: Four times a day (QID) | ORAL | Status: DC | PRN

## 2014-07-01 MED ORDER — METHOCARBAMOL 500 MG PO TABS: 1000.0000 mg | ORAL_TABLET | Freq: Four times a day (QID) | ORAL | Status: DC

## 2014-07-01 MED ORDER — METHOCARBAMOL 500 MG PO TABS
1000.0000 mg | ORAL_TABLET | Freq: Once | ORAL | Status: AC
  Administered 2014-07-01: 1000 mg via ORAL
  Filled 2014-07-01: qty 2

## 2014-07-01 NOTE — ED Provider Notes (Signed)
CSN: [id number]     Arrival date & time 07/01/14  1633 History   First MD Initiated Contact with Patient 07/01/14 1652     Chief Complaint  Patient presents with  . Back Pain     (Consider location/radiation/quality/duration/timing/severity/associated sxs/prior Treatment) HPI Comments: Patient with history of scoliosis, chronic pain and pain management -- states that he was lifting up a heavy object prior to arrival. Shortly afterwards he began having a sharp pain in his left middle to lower back with radiation into the back of his left leg. No treatments prior to arrival. This is different than the patient's typical scoliosis pain. Patient denies warning symptoms of back pain including: fecal incontinence, urinary retention or overflow incontinence, night sweats, waking from sleep with back pain, unexplained fevers or weight loss, h/o cancer, IVDU, recent trauma.    Per [place] substance reporting database, patient last received oxycodone-acetaminophen 10-325mg  tabs, #120 by Landry Corporal NP on 06/08/2014. He receives monthly prescriptions for narcotic pain medications from this provider.   Patient is a 44 y.o. male presenting with back pain. The history is provided by the patient and medical records.  Back Pain Associated symptoms: no fever, no numbness and no weakness     Past Medical History  Diagnosis Date  . Anxiety and depression   . Reflux   . Pain in joint, ankle and foot 10/19/2012  . Migraine   . Scoliosis    Past Surgical History  Procedure Laterality Date  . Ankle surgery    . Foot surgery     History reviewed. No pertinent family history. History  Substance Use Topics  . Smoking status: Former Games developer  . Smokeless tobacco: Never Used  . Alcohol Use: No    Review of Systems  Constitutional: Negative for fever and unexpected weight change.  Gastrointestinal: Negative for constipation.       Neg for fecal incontinence  Genitourinary: Negative for hematuria, flank pain  and difficulty urinating.       Negative for urinary incontinence or retention  Musculoskeletal: Positive for back pain.  Neurological: Negative for weakness and numbness.       Negative for saddle paresthesias       Allergies  Review of patient's allergies indicates no known allergies.  Home Medications   Prior to Admission medications   Medication Sig Start Date End Date Taking? Authorizing Provider  ibuprofen (ADVIL,MOTRIN) 800 MG tablet Take 1 tablet (800 mg total) by mouth 3 (three) times daily. 05/29/13   [name], MD  ibuprofen (ADVIL,MOTRIN) 800 MG tablet Take 1 tablet (800 mg total) by mouth 3 (three) times daily. 08/17/13   Geoffery Lyons, MD  meloxicam (MOBIC) 15 MG tablet Take 15 mg by mouth daily.    Historical Provider, MD  oxyCODONE-acetaminophen (PERCOCET) 10-325 MG per tablet Take 1 tablet by mouth every 8 (eight) hours as needed for pain. 11/15/12   Myeong Christianne Dolin, DPM  promethazine (PHENERGAN) 25 MG suppository Place 1 suppository (25 mg total) rectally every 6 (six) hours as needed for nausea or vomiting. 05/29/13   [name], MD  promethazine (PHENERGAN) 25 MG tablet Take 1 tablet (25 mg total) by mouth every 6 (six) hours as needed for nausea or vomiting. 05/29/13   [name], MD   BP 112/71 mmHg  Pulse 78  Temp(Src) 98.3 F (36.8 C) (Oral)  Resp 16  Ht  (1.702 m)  Wt 216 lb (97.977 kg)  BMI 33.82 kg/m2  SpO2 97%  Physical Exam  Constitutional: He appears well-developed and well-nourished.  HENT:  Head: Normocephalic and atraumatic.  Eyes: Conjunctivae are normal.  Neck: Normal range of motion.  Abdominal: Soft. There is no tenderness. There is no CVA tenderness.  Musculoskeletal: Normal range of motion.       Thoracic back: He exhibits tenderness. He exhibits normal range of motion and no bony tenderness.       Lumbar back: He exhibits tenderness. He exhibits normal range of motion and no bony tenderness.        Back:  No step-off noted with palpation of spine.   Neurological: He is alert. He has normal reflexes. No sensory deficit. He exhibits normal muscle tone.  5/5 strength in entire lower extremities bilaterally. No sensation deficit.   Skin: Skin is warm and dry.  Psychiatric: He has a normal mood and affect.  Nursing note and vitals reviewed.   ED Course  Procedures (including critical care time) Labs Review Labs Reviewed - No data to display  Imaging Review No results found.   EKG Interpretation None      5:09 PM Patient seen and examined. Work-up initiated. Medications ordered.   Vital signs reviewed and are as follows: Filed Vitals:   07/01/14 1642  BP: 112/71  Pulse: 78  Temp: 98.3 F (36.8 C)  Resp: 16    No red flag s/s of low back pain. Patient was counseled on back pain precautions and told to do activity as tolerated but do not lift, push, or pull heavy objects more than 10 pounds for the next week.  Patient counseled to use ice or heat on back for no longer than 15 minutes every hour.   Patient prescribed muscle relaxer and counseled on proper use of muscle relaxant medication.    Urged patient not to drink alcohol, drive, or perform any other activities that requires focus while taking either of these medications.  Patient urged to follow-up with PCP if pain does not improve with treatment and rest or if pain becomes recurrent. Urged to return with worsening severe pain, loss of bowel or bladder control, trouble walking.   The patient verbalizes understanding and agrees with the plan.      MDM   Final diagnoses:  Lumbosacral radiculopathy   Patient with back pain. No neurological deficits. Patient is ambulatory. No warning symptoms of back pain including: fecal incontinence, urinary retention or overflow incontinence, night sweats, waking from sleep with back pain, unexplained fevers or weight loss, h/o cancer, IVDU, recent trauma. No concern for cauda  equina, epidural abscess, or other serious cause of back pain. Conservative measures such as rest, ice/heat and pain medicine indicated with PCP follow-up if no improvement with conservative management.    Renne CriglerJoshua Scottie Metayer, PA-C 07/01/14 1712  Geoffery Lyonsouglas Delo, MD 07/01/14 2154

## 2014-07-01 NOTE — ED Notes (Signed)
Patient states that he was lifting a lawnmower at work and hurt his back.

## 2014-07-01 NOTE — Discharge Instructions (Signed)
Please read and follow all provided instructions.  Your diagnoses today include:  1. Lumbosacral radiculopathy     Tests performed today include:  Vital signs - see below for your results today  Medications prescribed:   Robaxin (methocarbamol) - muscle relaxer medication  DO NOT drive or perform any activities that require you to be awake and alert because this medicine can make you drowsy.    Ibuprofen (Motrin, Advil) - anti-inflammatory pain medication  Do not exceed 600mg  ibuprofen every 6 hours, take with food  You have been prescribed an anti-inflammatory medication or NSAID. Take with food. Take smallest effective dose for the shortest duration needed for your pain. Stop taking if you experience stomach pain or vomiting.   Take any prescribed medications only as directed.  Home care instructions:   Follow any educational materials contained in this packet  Please rest, use ice or heat on your back for the next several days  Do not lift, push, pull anything more than 10 pounds for the next week  Follow-up instructions: Please follow-up with your primary care provider in the next 1 week for further evaluation of your symptoms.   Return instructions:  SEEK IMMEDIATE MEDICAL ATTENTION IF YOU HAVE:  New numbness, tingling, weakness, or problem with the use of your arms or legs  Severe back pain not relieved with medications  Loss control of your bowels or bladder  Increasing pain in any areas of the body (such as chest or abdominal pain)  Shortness of breath, dizziness, or fainting.   Worsening nausea (feeling sick to your stomach), vomiting, fever, or sweats  Any other emergent concerns regarding your health   Additional Information:  Your vital signs today were: BP 112/71 mmHg   Pulse 78   Temp(Src) 98.3 F (36.8 C) (Oral)   Resp 16   Ht 5\' 7"  (1.702 m)   Wt 216 lb (97.977 kg)   BMI 33.82 kg/m2   SpO2 97% If your blood pressure (BP) was elevated above  135/85 this visit, please have this repeated by your doctor within one month. --------------

## 2014-08-28 ENCOUNTER — Emergency Department (HOSPITAL_BASED_OUTPATIENT_CLINIC_OR_DEPARTMENT_OTHER): Payer: Medicaid Other

## 2014-08-28 ENCOUNTER — Encounter (HOSPITAL_BASED_OUTPATIENT_CLINIC_OR_DEPARTMENT_OTHER): Payer: Self-pay | Admitting: *Deleted

## 2014-08-28 ENCOUNTER — Emergency Department (HOSPITAL_BASED_OUTPATIENT_CLINIC_OR_DEPARTMENT_OTHER)
Admission: EM | Admit: 2014-08-28 | Discharge: 2014-08-28 | Disposition: A | Discharge status: Home / Self Care | Payer: Medicaid Other | Attending: Emergency Medicine | Admitting: Emergency Medicine

## 2014-08-28 DIAGNOSIS — Z8679 Personal history of other diseases of the circulatory system: Secondary | ICD-10-CM | POA: Insufficient documentation

## 2014-08-28 DIAGNOSIS — S8992XA Unspecified injury of left lower leg, initial encounter: Secondary | ICD-10-CM | POA: Insufficient documentation

## 2014-08-28 DIAGNOSIS — Y9389 Activity, other specified: Secondary | ICD-10-CM | POA: Insufficient documentation

## 2014-08-28 DIAGNOSIS — Y9289 Other specified places as the place of occurrence of the external cause: Secondary | ICD-10-CM | POA: Insufficient documentation

## 2014-08-28 DIAGNOSIS — W208XXA Other cause of strike by thrown, projected or falling object, initial encounter: Secondary | ICD-10-CM | POA: Insufficient documentation

## 2014-08-28 DIAGNOSIS — Z8659 Personal history of other mental and behavioral disorders: Secondary | ICD-10-CM | POA: Insufficient documentation

## 2014-08-28 DIAGNOSIS — Z87891 Personal history of nicotine dependence: Secondary | ICD-10-CM | POA: Insufficient documentation

## 2014-08-28 DIAGNOSIS — S93601A Unspecified sprain of right foot, initial encounter: Secondary | ICD-10-CM

## 2014-08-28 DIAGNOSIS — Z8719 Personal history of other diseases of the digestive system: Secondary | ICD-10-CM | POA: Insufficient documentation

## 2014-08-28 DIAGNOSIS — S8991XA Unspecified injury of right lower leg, initial encounter: Secondary | ICD-10-CM | POA: Insufficient documentation

## 2014-08-28 DIAGNOSIS — Y998 Other external cause status: Secondary | ICD-10-CM | POA: Insufficient documentation

## 2014-08-28 DIAGNOSIS — M419 Scoliosis, unspecified: Secondary | ICD-10-CM | POA: Insufficient documentation

## 2014-08-28 MED ORDER — OXYCODONE-ACETAMINOPHEN 5-325 MG PO TABS
1.0000 | ORAL_TABLET | Freq: Once | ORAL | Status: AC
  Administered 2014-08-28: 1 via ORAL
  Filled 2014-08-28: qty 1

## 2014-08-28 NOTE — Discharge Instructions (Signed)
Joint Sprain °A sprain is a tear or stretch in the ligaments that hold a joint together. Severe sprains may need as long as 3-6 weeks of immobilization and/or exercises to heal completely. Sprained joints should be rested and protected. If not, they can become unstable and prone to re-injury. Proper treatment can reduce your pain, shorten the period of disability, and reduce the risk of repeated injuries. °TREATMENT  °· Rest and elevate the injured joint to reduce pain and swelling. °· Apply ice packs to the injury for 20-30 minutes every 2-3 hours for the next 2-3 days. °· Keep the injury wrapped in a compression bandage or splint as long as the joint is painful or as instructed by your caregiver. °· Do not use the injured joint until it is completely healed to prevent re-injury and chronic instability. Follow the instructions of your caregiver. °· Long-term sprain management may require exercises and/or treatment by a physical therapist. Taping or special braces may help stabilize the joint until it is completely better. °SEEK MEDICAL CARE IF:  °· You develop increased pain or swelling of the joint. °· You develop increasing redness and warmth of the joint. °· You develop a fever. °· It becomes stiff. °· Your hand or foot gets cold or numb. °Document Released: 03/27/2004 Document Revised: 05/12/2011 Document Reviewed: 03/06/2008 °ExitCare® Patient Information ©2015 [employer]. This information is not intended to replace advice given to you by your health care provider. Make sure you discuss any questions you have with your health care provider. ° °

## 2014-08-28 NOTE — ED Notes (Signed)
Pt c/o right ankle pain x 1 day
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
## 2014-09-12 ENCOUNTER — Ambulatory Visit: Payer: Worker's Compensation

## 2014-09-12 ENCOUNTER — Ambulatory Visit (INDEPENDENT_AMBULATORY_CARE_PROVIDER_SITE_OTHER): Payer: Worker's Compensation | Admitting: Family Medicine

## 2014-09-12 VITALS — BP 118/82 | HR 76 | Temp 98.3°F | Resp 16 | Ht 68.0 in | Wt 225.0 lb

## 2014-09-12 DIAGNOSIS — S93402A Sprain of unspecified ligament of left ankle, initial encounter: Secondary | ICD-10-CM | POA: Diagnosis not present

## 2014-09-12 DIAGNOSIS — S99912A Unspecified injury of left ankle, initial encounter: Secondary | ICD-10-CM

## 2014-09-12 MED ORDER — IBUPROFEN 800 MG PO TABS: 800.0000 mg | ORAL_TABLET | Freq: Three times a day (TID) | ORAL | Status: DC | PRN

## 2014-09-12 NOTE — Progress Notes (Signed)
Carlos Christensen Sep 12, 1970 43 y.o.   Chief Complaint  Patient presents with  . Ankle Pain    Left, X yesterday   Date of Injury: 09/11/14 approximately 2:30 pm  History of Present Illness:  Presents for evaluation of work-related complaint.  The patient was walking between two buildings and stepped in a depressed area of sidewalk, twisting his ankle. He initially felt a pop and had little pain. Within a short period of time, he had pain radiating from his outer ankle up his outer shin, when he got home, he applied ice and took ibuprofen 800 mg. He noticed swelling which has gotten worse today as has the pain.  No wrap/bandage.  Patient has had reconstructive surgery of both ankles  ROS No fall, some weakness   No Known Allergies   Current medications reviewed and updated. Past medical history, family history, social history have been reviewed and updated.   Physical Exam  Constitutional: He is oriented to person, place, and time and well-developed, well-nourished, and in no distress.  HENT:  Head: Normocephalic.  Neck: Normal range of motion. Neck supple.  Musculoskeletal:       Left ankle: He exhibits decreased range of motion (flexion). He exhibits no ecchymosis. Swelling: ? mild swelling lateral malleolus. Tenderness. Lateral malleolus tenderness found. Achilles tendon exhibits pain.  Neurological: He is alert and oriented to person, place, and time.  Vitals reviewed.  BP 118/82 mmHg  Pulse 76  Temp(Src) 98.3 F (36.8 C) (Oral)  Resp 16  Ht 5\' 8"  (1.727 m)  Wt 225 lb (102.059 kg)  BMI 34.22 kg/m2  SpO2 99%  [facility] reading (PRIMARY) by  Dr. Conley RollsLe: negative for acute bony abnormality.   IMPRESSION: Stable postoperative findings of midfoot. Remote avulsion of the medial malleolus No acute osseous finding or malalignment.  Assessment and Plan:  1. Left ankle injury, initial encounter 2. Sprain of ankle, left, initial encounter Pt was under care of Deboraha SprangDebbie Gessner  but had to leave at 4 pm to pick up child. He returned at 6 pm and I finished the visit. Radiograph negative. Likely with sprain of left ankle. Fit for cam walker. Prescribed ibuprofen 800 mg to take TID prn pain. Counseled on RICE. Light duty at work. Return in 1 week for follow up. - DG Ankle Complete Left; Future - ibuprofen (ADVIL,MOTRIN) 800 MG tablet; Take 1 tablet (800 mg total) by mouth every 8 (eight) hours as needed.  Dispense: 30 tablet; Refill: 0   Roswell MinersNicole V. Dyke BrackettBush, PA-C, MHS [facility]  09/12/2014

## 2014-09-12 NOTE — Patient Instructions (Signed)
Ibuprofen three times a day as needed. Rest, ice, compression, elevation can help. Return in 1 week for follow up. Light duty at work.  RICE: Routine Care for Injuries The routine care of many injuries includes Rest, Ice, Compression, and Elevation (RICE). HOME CARE INSTRUCTIONS  Rest is needed to allow your body to heal. Routine activities can usually be resumed when comfortable. Injured tendons and bones can take up to 6 weeks to heal. Tendons are the cord-like structures that attach muscle to bone.  Ice following an injury helps keep the swelling down and reduces pain.  Put ice in a plastic bag.  Place a towel between your skin and the bag.  Leave the ice on for 15-20 minutes, 3-4 times a day, or as directed by your health care provider. Do this while awake, for the first 24 to 48 hours. After that, continue as directed by your caregiver.  Compression helps keep swelling down. It also gives support and helps with discomfort. If an elastic bandage has been applied, it should be removed and reapplied every 3 to 4 hours. It should not be applied tightly, but firmly enough to keep swelling down. Watch fingers or toes for swelling, bluish discoloration, coldness, numbness, or excessive pain. If any of these problems occur, remove the bandage and reapply loosely. Contact your caregiver if these problems continue.  Elevation helps reduce swelling and decreases pain. With extremities, such as the arms, hands, legs, and feet, the injured area should be placed near or above the level of the heart, if possible. SEEK IMMEDIATE MEDICAL CARE IF:  You have persistent pain and swelling.  You develop redness, numbness, or unexpected weakness.  Your symptoms are getting worse rather than improving after several days. These symptoms may indicate that further evaluation or further X-rays are needed. Sometimes, X-rays may not show a small broken bone (fracture) until 1 week or 10 days later. Make a  follow-up appointment with your caregiver. Ask when your X-ray results will be ready. Make sure you get your X-ray results. Document Released: 06/01/2000 Document Revised: 02/22/2013 Document Reviewed: 07/19/2010 [facility] Patient Information 2015 [employer] [place]. This information is not intended to replace advice given to you by your health care provider. Make sure you discuss any questions you have with your health care provider.

## 2014-09-12 NOTE — Progress Notes (Deleted)
Subjective:     Patient ID: Carlos LawmanMarlon Streicher, male   DOB: 06-06-1970, 44 y.o.   MRN: [id number]  HPI   Review of Systems     Objective:   Physical Exam     Assessment:     ***    Plan:     ***

## 2014-09-12 NOTE — Progress Notes (Deleted)
   Subjective:    Patient ID: Carlos Christensen, male    DOB: 08/12/1970, 44 y.o.   MRN: [id number]  HPI .[facility]w    Review of Systems     Objective:   Physical Exam        Assessment & Plan:

## 2014-09-14 NOTE — Progress Notes (Signed)
The recorded information has been reviewed and considered. Agree with A/P Dr Conley RollsLe   IMPRESSION: Stable postoperative findings of midfoot.  Remote avulsion of the medial malleolus  No acute osseous finding or malalignment.   Electronically Signed  By: Judie PetitM. Shick M.D.  On: 09/12/2014 18:56

## 2014-09-19 ENCOUNTER — Ambulatory Visit (INDEPENDENT_AMBULATORY_CARE_PROVIDER_SITE_OTHER): Payer: Worker's Compensation | Admitting: Family Medicine

## 2014-09-19 VITALS — BP 124/80 | HR 72 | Temp 98.0°F | Resp 20 | Ht 68.0 in | Wt 223.2 lb

## 2014-09-19 DIAGNOSIS — S86019A Strain of unspecified Achilles tendon, initial encounter: Secondary | ICD-10-CM | POA: Insufficient documentation

## 2014-09-19 DIAGNOSIS — S93409A Sprain of unspecified ligament of unspecified ankle, initial encounter: Secondary | ICD-10-CM | POA: Insufficient documentation

## 2014-09-19 DIAGNOSIS — S86012D Strain of left Achilles tendon, subsequent encounter: Secondary | ICD-10-CM | POA: Diagnosis not present

## 2014-09-19 MED ORDER — TRAMADOL HCL 50 MG PO TABS: 50.0000 mg | ORAL_TABLET | Freq: Three times a day (TID) | ORAL | Status: DC | PRN

## 2014-09-19 NOTE — Progress Notes (Signed)
  Subjective:  Patient ID: Carlos Christensen, male    DOB: June 18, 1970  Age: 44 y.o. MRN: [id number]  Patient is here for follow-up with regard to his ankle sprain. It continues to hurt quite a lot. He would like something more for pain since the ibuprofen alone does not help it. He is on his feet all the time at work, so I did not work at all this week and is just 8 home elevating the foot. 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 He is faithful with the use of the boot and has been using a lot of ice.  The insurance people have talked to the job and apparently a sitdown position is not available to him. They're checking with some nonprofits to see if he can be used there   Objective:   Still has a little ecchymosis in the left ankle around and behind the lateral malleolus, up the leg about 4 inches. He has tenderness along the posterior portion of the ankle, and is tender over the Achilles about 3 inches above the attachment. I cannot feel a double indicating a torn segment. A lot of tenderness below the lateral malleolus.  Assessment & Plan:   Assessment: Sprain ankle, and strain of Achilles tendon  Plan: Patient Instructions  We will request referral to sports medicine for further evaluation, especially because the sprain is also tender in the Achilles tendon region  Continue to elevate. I think it is okay at this point to try using some heat on it and see if that gives any better relief.  Continue taking the ibuprofen  Take tramadol 1 every 8 hours only as needed for severe pain  If you have not gotten an appointment to see the sports medicine people by next Tuesday please return for a follow-up check here.    Joany Khatib, MD 09/19/2014

## 2014-09-19 NOTE — Patient Instructions (Signed)
We will request referral to sports medicine for further evaluation, especially because the sprain is also tender in the Achilles tendon region  Continue to elevate. I think it is okay at this point to try using some heat on it and see if that gives any better relief.  Continue taking the ibuprofen  Take tramadol 1 every 8 hours only as needed for severe pain  If you have not gotten an appointment to see the sports medicine people by next Tuesday please return for a follow-up check here.

## 2014-09-26 ENCOUNTER — Ambulatory Visit (INDEPENDENT_AMBULATORY_CARE_PROVIDER_SITE_OTHER): Payer: Worker's Compensation | Admitting: Internal Medicine

## 2014-09-26 VITALS — BP 126/80 | HR 74 | Temp 97.9°F | Resp 16 | Ht 68.0 in | Wt 222.0 lb

## 2014-09-26 DIAGNOSIS — S93402D Sprain of unspecified ligament of left ankle, subsequent encounter: Secondary | ICD-10-CM

## 2014-09-26 MED ORDER — IBUPROFEN 800 MG PO TABS: 800.0000 mg | ORAL_TABLET | Freq: Three times a day (TID) | ORAL | Status: DC | PRN

## 2014-09-26 NOTE — Progress Notes (Signed)
Subjective:  This chart was scribed for Carlos Sia, MD by Broadus John, Medical Scribe. This patient was seen in Room 1 and the patient's care was started at 3:26 PM.    Patient ID: Carlos Christensen, male    DOB: Jul 02, 1970, 44 y.o.   MRN: [id number]  Chief Complaint  Patient presents with  . Follow-up    Workers Comp, left ankle     HPI HPI Comments: Carlos Christensen is a 44 y.o. male who presents to [facility] for follow up regarding a left ankle injury 2 weeks ago.  Pt notes that the pain is still severe that it disturbs his sleep at times. He reports that he only does light activities to improve his recovery. He states that he uses ice packs and ibuprofen at night time for the pain. Pt indicates that the ice packs do reduce the swelling to the area when applied. Pt notes that his pain medications do cause him to be more somnolent, and he is concerned to go back to work with having such side effects. Pt states that he was referred to sports medicine by Dr. Alwyn Ren at his last visit, however he was not able to get an appointment with them yet.  Pt is requesting a medication refill for Ibuprofen. He was able to walk without the boot yesterday for a few hours but then had increased pain later in the day. He now notices swelling and discomfort on the medial side of the ankle as well.   Patient Active Problem List   Diagnosis Date Noted  . Sprain of ankle 09/19/2014  . Strain of Achilles tendon 09/19/2014  . Pain in joint, ankle and foot 10/19/2012  . Tendonitis of ankle 10/19/2012   Past Medical History  Diagnosis Date  . Anxiety and depression   . Reflux   . Pain in joint, ankle and foot 10/19/2012  . Migraine   . Scoliosis   . Depression    Past Surgical History  Procedure Laterality Date  . Ankle surgery    . Foot surgery     No Known Allergies Prior to Admission medications   Medication Sig Start Date End Date Taking? Authorizing Provider    ibuprofen (ADVIL,MOTRIN) 800 MG tablet Take 1 tablet (800 mg total) by mouth every 8 (eight) hours as needed. 09/12/14  Yes Lanier Clam V, PA-C  lamoTRIgine (LAMICTAL) 100 MG tablet Take 100 mg by mouth daily. Take 1/4 for two weeks then 1/2 tablet   Yes Historical Provider, MD  traMADol (ULTRAM) 50 MG tablet Take 1 tablet (50 mg total) by mouth every 8 (eight) hours as needed. 09/19/14  Yes Peyton Najjar, MD   History   Social History  . Marital Status: Married    Spouse Name: N/A  . Number of Children: N/A  . Years of Education: N/A   Occupational History  . Not on file.   Social History Main Topics  . Smoking status: Former Games developer  . Smokeless tobacco: Never Used  . Alcohol Use: No  . Drug Use: No  . Sexual Activity: Not on file   Other Topics Concern  . Not on file   Social History Narrative    Review of Systems  Musculoskeletal: Positive for joint swelling (knee) and arthralgias (knee).  Psychiatric/Behavioral: Positive for sleep disturbance.      Objective:   Physical Exam  Constitutional: He is oriented to person, place, and time. He appears well-developed and well-nourished. No distress.  HENT:  Head: Normocephalic and atraumatic.  Eyes: EOM are normal. Pupils are equal, round, and reactive to light.  Neck: Neck supple.  Cardiovascular: Normal rate.   Pulmonary/Chest: Effort normal.  Musculoskeletal:  The left ankle has minimal swelling on the lateral aspect but is still tender over the distal fibula the calcaneofibular and talofibular ligaments with pain on eversion and inversion He has slight swelling in the posterior aspect of the deltoid ligament with tenderness to palpation but there is no tenderness along the distal tibia Drawer is stable Plantarflexion and dorsiflexion are fair with pain at the ends of range of motion  Neurological: He is alert and oriented to person, place, and time. No cranial nerve deficit.  Skin: Skin is warm and dry. Rash noted.   Nursing note and vitals reviewed.  BP 126/80 mmHg  Pulse 74  Temp(Src) 97.9 F (36.6 C) (Oral)  Resp 16  Ht  (1.727 m)  Wt 222 lb (100.699 kg)  BMI 33.76 kg/m2  SpO2 98%     Assessment & Plan:  I have completed the patient encounter in its entirety as documented by the scribe, with editing by me where necessary. Cadance Raus P. Merla Riches, M.D.  Sprain of ankle, left, subsequent encounter - Plan: ibuprofen (ADVIL,MOTRIN) 800 MG tablet  Meds ordered this encounter  Medications  . ibuprofen (ADVIL,MOTRIN) 800 MG tablet    Sig: Take 1 tablet (800 mg total) by mouth every 8 (eight) hours as needed.    Dispense:  30 tablet    Refill:  0   He is ready to start physical therapy and be more active with his ankle in order to restore range of motion, get rid of the swelling, and get rid of the pain.

## 2014-11-05 ENCOUNTER — Emergency Department (HOSPITAL_BASED_OUTPATIENT_CLINIC_OR_DEPARTMENT_OTHER): Payer: Worker's Compensation

## 2014-11-05 ENCOUNTER — Emergency Department (HOSPITAL_BASED_OUTPATIENT_CLINIC_OR_DEPARTMENT_OTHER)
Admission: EM | Admit: 2014-11-05 | Discharge: 2014-11-05 | Disposition: A | Discharge status: Home / Self Care | Payer: Worker's Compensation | Attending: Emergency Medicine | Admitting: Emergency Medicine

## 2014-11-05 ENCOUNTER — Encounter (HOSPITAL_BASED_OUTPATIENT_CLINIC_OR_DEPARTMENT_OTHER): Payer: Self-pay | Admitting: Emergency Medicine

## 2014-11-05 DIAGNOSIS — G8929 Other chronic pain: Secondary | ICD-10-CM | POA: Insufficient documentation

## 2014-11-05 DIAGNOSIS — Z87891 Personal history of nicotine dependence: Secondary | ICD-10-CM | POA: Insufficient documentation

## 2014-11-05 DIAGNOSIS — Z8719 Personal history of other diseases of the digestive system: Secondary | ICD-10-CM | POA: Insufficient documentation

## 2014-11-05 DIAGNOSIS — G43909 Migraine, unspecified, not intractable, without status migrainosus: Secondary | ICD-10-CM | POA: Diagnosis not present

## 2014-11-05 DIAGNOSIS — F419 Anxiety disorder, unspecified: Secondary | ICD-10-CM | POA: Insufficient documentation

## 2014-11-05 DIAGNOSIS — M79605 Pain in left leg: Secondary | ICD-10-CM | POA: Diagnosis present

## 2014-11-05 DIAGNOSIS — M25572 Pain in left ankle and joints of left foot: Secondary | ICD-10-CM | POA: Insufficient documentation

## 2014-11-05 DIAGNOSIS — Z9889 Other specified postprocedural states: Secondary | ICD-10-CM | POA: Insufficient documentation

## 2014-11-05 DIAGNOSIS — F329 Major depressive disorder, single episode, unspecified: Secondary | ICD-10-CM | POA: Insufficient documentation

## 2014-11-05 MED ORDER — KETOROLAC TROMETHAMINE 60 MG/2ML IM SOLN
60.0000 mg | Freq: Once | INTRAMUSCULAR | Status: AC
  Administered 2014-11-05: 60 mg via INTRAMUSCULAR
  Filled 2014-11-05: qty 2

## 2014-11-05 NOTE — Discharge Instructions (Signed)
Rest, ice and elevate your ankle. Continue taking the tramadol that you have at home for pain.  Ankle Pain Ankle pain is a common symptom. The bones, cartilage, tendons, and muscles of the ankle joint perform a lot of work each day. The ankle joint holds your body weight and allows you to move around. Ankle pain can occur on either side or back of 1 or both ankles. Ankle pain may be sharp and burning or dull and aching. There may be tenderness, stiffness, redness, or warmth around the ankle. The pain occurs more often when a person walks or puts pressure on the ankle. CAUSES  There are many reasons ankle pain can develop. It is important to work with your caregiver to identify the cause since many conditions can impact the bones, cartilage, muscles, and tendons. Causes for ankle pain include:  Injury, including a break (fracture), sprain, or strain often due to a fall, sports, or a high-impact activity.  Swelling (inflammation) of a tendon (tendonitis).  Achilles tendon rupture.  Ankle instability after repeated sprains and strains.  Poor foot alignment.  Pressure on a nerve (tarsal tunnel syndrome).  Arthritis in the ankle or the lining of the ankle.  Crystal formation in the ankle (gout or pseudogout). DIAGNOSIS  A diagnosis is based on your medical history, your symptoms, results of your physical exam, and results of diagnostic tests. Diagnostic tests may include X-ray exams or a computerized magnetic scan (magnetic resonance imaging, MRI). TREATMENT  Treatment will depend on the cause of your ankle pain and may include:  Keeping pressure off the ankle and limiting activities.  Using crutches or other walking support (a cane or brace).  Using rest, ice, compression, and elevation.  Participating in physical therapy or home exercises.  Wearing shoe inserts or special shoes.  Losing weight.  Taking medications to reduce pain or swelling or receiving an injection.  Undergoing  surgery. HOME CARE INSTRUCTIONS   Only take over-the-counter or prescription medicines for pain, discomfort, or fever as directed by your caregiver.  Put ice on the injured area.  Put ice in a plastic bag.  Place a towel between your skin and the bag.  Leave the ice on for 15-20 minutes at a time, 03-04 times a day.  Keep your leg raised (elevated) when possible to lessen swelling.  Avoid activities that cause ankle pain.  Follow specific exercises as directed by your caregiver.  Record how often you have ankle pain, the location of the pain, and what it feels like. This information may be helpful to you and your caregiver.  Ask your caregiver about returning to work or sports and whether you should drive.  Follow up with your caregiver for further examination, therapy, or testing as directed. SEEK MEDICAL CARE IF:   Pain or swelling continues or worsens beyond 1 week.  You have an oral temperature above 102 F (38.9 C).  You are feeling unwell or have chills.  You are having an increasingly difficult time with walking.  You have loss of sensation or other new symptoms.  You have questions or concerns. MAKE SURE YOU:   Understand these instructions.  Will watch your condition.  Will get help right away if you are not doing well or get worse. Document Released: 08/07/2009 Document Revised: 05/12/2011 Document Reviewed: 08/07/2009 [facility] Patient Information 2015 [place], [place]. This information is not intended to replace advice given to you by your health care provider. Make sure you discuss any questions you have with  your health care provider.  Chronic Pain Chronic pain can be defined as pain that is off and on and lasts for 3-6 months or longer. Many things cause chronic pain, which can make it difficult to make a diagnosis. There are many treatment options available for chronic pain. However, finding a treatment that works well for you may require trying various  approaches until the right one is found. Many people benefit from a combination of two or more types of treatment to control their pain. SYMPTOMS  Chronic pain can occur anywhere in the body and can range from mild to very severe. Some types of chronic pain include:  Headache.  Low back pain.  Cancer pain.  Arthritis pain.  Neurogenic pain. This is pain resulting from damage to nerves. People with chronic pain may also have other symptoms such as:  Depression.  Anger.  Insomnia.  Anxiety. DIAGNOSIS  Your health care provider will help diagnose your condition over time. In many cases, the initial focus will be on excluding possible conditions that could be causing the pain. Depending on your symptoms, your health care provider may order tests to diagnose your condition. Some of these tests may include:   Blood tests.   CT scan.   MRI.   X-rays.   Ultrasounds.   Nerve conduction studies.  You may need to see a specialist.  TREATMENT  Finding treatment that works well may take time. You may be referred to a pain specialist. He or she may prescribe medicine or therapies, such as:   Mindful meditation or yoga.  Shots (injections) of numbing or pain-relieving medicines into the spine or area of pain.  Local electrical stimulation.  Acupuncture.   Massage therapy.   Aroma, color, light, or sound therapy.   Biofeedback.   Working with a physical therapist to keep from getting stiff.   Regular, gentle exercise.   Cognitive or behavioral therapy.   Group support.  Sometimes, surgery may be recommended.  HOME CARE INSTRUCTIONS   Take all medicines as directed by your health care provider.   Lessen stress in your life by relaxing and doing things such as listening to calming music.   Exercise or be active as directed by your health care provider.   Eat a healthy diet and include things such as vegetables, fruits, fish, and lean meats in your  diet.   Keep all follow-up appointments with your health care provider.   Attend a support group with others suffering from chronic pain. SEEK MEDICAL CARE IF:   Your pain gets worse.   You develop a new pain that was not there before.   You cannot tolerate medicines given to you by your health care provider.   You have new symptoms since your last visit with your health care provider.  SEEK IMMEDIATE MEDICAL CARE IF:   You feel weak.   You have decreased sensation or numbness.   You lose control of bowel or bladder function.   Your pain suddenly gets much worse.   You develop shaking.  You develop chills.  You develop confusion.  You develop chest pain.  You develop shortness of breath.  MAKE SURE YOU:  Understand these instructions.  Will watch your condition.  Will get help right away if you are not doing well or get worse. Document Released: 11/09/2001 Document Revised: 10/20/2012 Document Reviewed: 08/13/2012 [facility] Patient Information 2015 [place], [place]. This information is not intended to replace advice given to you by your health care  provider. Make sure you discuss any questions you have with your health care provider. ° °

## 2014-11-05 NOTE — ED Notes (Signed)
Pt reports that he "rolled" his left ankle at work back in July and has been doing physical therapy twice weekly, pt is being followed by [facility], this am he awoke in severe pain and states left ankle is blue, pt states he did not take any pain medication this am.

## 2014-11-05 NOTE — ED Provider Notes (Signed)
CSN: [id number]     Arrival date & time 11/05/14  1125 History   First MD Initiated Contact with Patient 11/05/14 1201     Chief Complaint  Patient presents with  . Leg Pain     (Consider location/radiation/quality/duration/timing/severity/associated sxs/prior Treatment) HPI Comments: 44 year old male with a history of chronic bilateral ankle and foot pain presenting with worsening left lower leg and ankle pain 3 days. 2 months ago, he rolled his left ankle at work and has been followed by orthopedics Dr. Althea Charon and going through physical therapy. Has been taking tramadol for the pain. No new injury or trauma, however over the past 3 nights, has had a sharp, throbbing, constant pain in his ankle that is keeping him up at night rated 10/10. He believes this may be because physical therapy advised him to come out of his [employer] a few weeks back and now he is applying pressure to his foot. States the inside of his ankle and lower leg feel swollen and are very tender to touch. Reports nerve damage the leg from "all the tendon injury". Denies fever or chills.  Patient is a 44 y.o. male presenting with leg pain. The history is provided by the patient.  Leg Pain   Past Medical History  Diagnosis Date  . Anxiety and depression   . Reflux   . Pain in joint, ankle and foot 10/19/2012  . Migraine   . Scoliosis   . Depression    Past Surgical History  Procedure Laterality Date  . Ankle surgery    . Foot surgery     History reviewed. No pertinent family history. Social History  Substance Use Topics  . Smoking status: Former Games developer  . Smokeless tobacco: Never Used  . Alcohol Use: No    Review of Systems  Musculoskeletal:       + L lower leg/ankle pain and swelling.  All other systems reviewed and are negative.     Allergies  Review of patient's allergies indicates no known allergies.  Home Medications   Prior to Admission medications   Medication Sig Start Date End Date Taking?  Authorizing Provider  ibuprofen (ADVIL,MOTRIN) 800 MG tablet Take 1 tablet (800 mg total) by mouth every 8 (eight) hours as needed. 09/26/14  Yes Tonye Pearson, MD  traMADol (ULTRAM) 50 MG tablet Take 1 tablet (50 mg total) by mouth every 8 (eight) hours as needed. 09/19/14  Yes Peyton Najjar, MD  lamoTRIgine (LAMICTAL) 100 MG tablet Take 100 mg by mouth daily. Take 1/4 for two weeks then 1/2 tablet    Historical Provider, MD   BP 116/75 mmHg  Pulse 63  Temp(Src) 97.7 F (36.5 C) (Oral)  Resp 20  Ht  (1.727 m)  Wt 230 lb (104.327 kg)  BMI 34.98 kg/m2  SpO2 98% Physical Exam  Constitutional: He is oriented to person, place, and time. He appears well-developed and well-nourished. No distress.  HENT:  Head: Normocephalic and atraumatic.  Eyes: Conjunctivae and EOM are normal.  Neck: Normal range of motion. Neck supple.  Cardiovascular: Normal rate, regular rhythm and normal heart sounds.   Pulmonary/Chest: Effort normal and breath sounds normal.  Musculoskeletal:  L ankle- extreme TTP throughout, more so medially, patient screams in pain with the slightest touch unless distracted. TTP lower aspect of calf. No swelling, bruising or palpable cords. TTP over achilles which is intact. Surgical scars noted. +2 PT/DP pulse. Will not allow range of motion secondary to pain.  Neurological: He  is alert and oriented to person, place, and time.  Skin: Skin is warm and dry.  Psychiatric: He has a normal mood and affect. His behavior is normal.  Nursing note and vitals reviewed.   ED Course  Procedures (including critical care time) Labs Review Labs Reviewed - No data to display  Imaging Review US Venous Img Lower Unilateral Left  11/05/2014   CLINICAL DATA:  LEFT medial calf and foot pain and swelling for 2 months. History ankle injury at work. Increasing pain.  EXAM: LEFT LOWER EXTREMITY VENOUS DOPPLER ULTRASOUND  TECHNIQUE: Gray-scale sonography with graded compression, as well as  color Doppler and duplex ultrasound were performed to evaluate the lower extremity deep venous systems from the level of the common femoral vein and including the common femoral, femoral, profunda femoral, popliteal and calf veins including the posterior tibial, peroneal and gastrocnemius veins when visible. The superficial great saphenous vein was also interrogated. Spectral Doppler was utilized to evaluate flow at rest and with distal augmentation maneuvers in the common femoral, femoral and popliteal veins.  COMPARISON:  None.  FINDINGS: Contralateral Common Femoral Vein: Respiratory phasicity is normal and symmetric with the symptomatic side. No evidence of thrombus. Normal compressibility.  Common Femoral Vein: No evidence of thrombus. Normal compressibility, respiratory phasicity and response to augmentation.  Saphenofemoral Junction: No evidence of thrombus. Normal compressibility and flow on color Doppler imaging.  Profunda Femoral Vein: No evidence of thrombus. Normal compressibility and flow on color Doppler imaging.  Femoral Vein: No evidence of thrombus. Normal compressibility, respiratory phasicity and response to augmentation.  Popliteal Vein: No evidence of thrombus. Normal compressibility, respiratory phasicity and response to augmentation.  Calf Veins: No evidence of thrombus. Normal compressibility and flow on color Doppler imaging.  Superficial Great Saphenous Vein: No evidence of thrombus. Normal compressibility and flow on color Doppler imaging.  IMPRESSION: No evidence of left deep venous thrombosis.   Electronically Signed   By: Genevive Bi M.D.   On: 11/05/2014 13:03   I have personally reviewed and evaluated these images and lab results as part of my medical decision-making.   EKG Interpretation None      MDM   Final diagnoses:  Chronic ankle pain, left   Neurovascularly intact distally. With palpation unless distracted and does not seem to be in pain. Ultrasound obtained  given the calf tenderness and stated swelling the patient has seen to rule out DVT, especially with prior trauma to the leg. Venous duplex negative. Patient has an appointment in 2 days with his orthopedist, I advised him to continue taking the tramadol he has prescribed at home, ice and elevate his leg and follow-up with orthopedics. Stable for discharge. Return precautions given. Patient states understanding of treatment care plan and is agreeable.    Kathrynn Speed, PA-C 11/05/14 1316  Rolan Bucco, MD 11/05/14 1356

## 2014-12-08 ENCOUNTER — Other Ambulatory Visit: Payer: Self-pay | Admitting: Family Medicine

## 2014-12-13 ENCOUNTER — Other Ambulatory Visit: Payer: Self-pay | Admitting: Family Medicine

## 2014-12-13 DIAGNOSIS — M25572 Pain in left ankle and joints of left foot: Secondary | ICD-10-CM

## 2014-12-22 ENCOUNTER — Ambulatory Visit
Admission: RE | Admit: 2014-12-22 | Discharge: 2014-12-22 | Disposition: A | Discharge status: Home / Self Care | Payer: Worker's Compensation | Source: Ambulatory Visit | Attending: Family Medicine | Admitting: Family Medicine

## 2014-12-22 DIAGNOSIS — M25572 Pain in left ankle and joints of left foot: Secondary | ICD-10-CM

## 2015-01-09 ENCOUNTER — Emergency Department (HOSPITAL_BASED_OUTPATIENT_CLINIC_OR_DEPARTMENT_OTHER)
Admission: EM | Admit: 2015-01-09 | Discharge: 2015-01-09 | Disposition: A | Discharge status: Home / Self Care | Payer: No Typology Code available for payment source | Attending: Emergency Medicine | Admitting: Emergency Medicine

## 2015-01-09 ENCOUNTER — Emergency Department (HOSPITAL_BASED_OUTPATIENT_CLINIC_OR_DEPARTMENT_OTHER): Payer: No Typology Code available for payment source

## 2015-01-09 ENCOUNTER — Encounter (HOSPITAL_COMMUNITY): Payer: Self-pay | Admitting: Emergency Medicine

## 2015-01-09 ENCOUNTER — Encounter (HOSPITAL_BASED_OUTPATIENT_CLINIC_OR_DEPARTMENT_OTHER): Payer: Self-pay | Admitting: *Deleted

## 2015-01-09 ENCOUNTER — Emergency Department (HOSPITAL_COMMUNITY)
Admission: EM | Admit: 2015-01-09 | Discharge: 2015-01-09 | Discharge status: Against Medical Advice | Payer: No Typology Code available for payment source | Attending: Emergency Medicine | Admitting: Emergency Medicine

## 2015-01-09 DIAGNOSIS — Z720 Tobacco use: Secondary | ICD-10-CM | POA: Insufficient documentation

## 2015-01-09 DIAGNOSIS — Y92410 Unspecified street and highway as the place of occurrence of the external cause: Secondary | ICD-10-CM | POA: Insufficient documentation

## 2015-01-09 DIAGNOSIS — Y9389 Activity, other specified: Secondary | ICD-10-CM | POA: Diagnosis not present

## 2015-01-09 DIAGNOSIS — T1490XA Injury, unspecified, initial encounter: Secondary | ICD-10-CM

## 2015-01-09 DIAGNOSIS — F329 Major depressive disorder, single episode, unspecified: Secondary | ICD-10-CM | POA: Diagnosis not present

## 2015-01-09 DIAGNOSIS — S199XXA Unspecified injury of neck, initial encounter: Secondary | ICD-10-CM | POA: Insufficient documentation

## 2015-01-09 DIAGNOSIS — S3992XA Unspecified injury of lower back, initial encounter: Secondary | ICD-10-CM | POA: Insufficient documentation

## 2015-01-09 DIAGNOSIS — S93402D Sprain of unspecified ligament of left ankle, subsequent encounter: Secondary | ICD-10-CM

## 2015-01-09 DIAGNOSIS — F419 Anxiety disorder, unspecified: Secondary | ICD-10-CM | POA: Insufficient documentation

## 2015-01-09 DIAGNOSIS — Z79899 Other long term (current) drug therapy: Secondary | ICD-10-CM | POA: Diagnosis not present

## 2015-01-09 DIAGNOSIS — Y998 Other external cause status: Secondary | ICD-10-CM | POA: Diagnosis not present

## 2015-01-09 DIAGNOSIS — S3991XA Unspecified injury of abdomen, initial encounter: Secondary | ICD-10-CM | POA: Insufficient documentation

## 2015-01-09 DIAGNOSIS — M419 Scoliosis, unspecified: Secondary | ICD-10-CM | POA: Diagnosis not present

## 2015-01-09 DIAGNOSIS — G43909 Migraine, unspecified, not intractable, without status migrainosus: Secondary | ICD-10-CM | POA: Insufficient documentation

## 2015-01-09 DIAGNOSIS — S0990XA Unspecified injury of head, initial encounter: Secondary | ICD-10-CM | POA: Diagnosis not present

## 2015-01-09 DIAGNOSIS — Z8739 Personal history of other diseases of the musculoskeletal system and connective tissue: Secondary | ICD-10-CM | POA: Diagnosis not present

## 2015-01-09 DIAGNOSIS — Z8719 Personal history of other diseases of the digestive system: Secondary | ICD-10-CM | POA: Insufficient documentation

## 2015-01-09 LAB — CBC WITH DIFFERENTIAL/PLATELET
BASOS PCT: 0 %
Basophils Absolute: 0 10*3/uL (ref 0.0–0.1)
Eosinophils Absolute: 0 10*3/uL (ref 0.0–0.7)
Eosinophils Relative: 0 %
HEMATOCRIT: 38.9 % — AB (ref 39.0–52.0)
HEMOGLOBIN: 13.4 g/dL (ref 13.0–17.0)
Lymphocytes Relative: 21 %
Lymphs Abs: 1.8 10*3/uL (ref 0.7–4.0)
MCH: 30.2 pg (ref 26.0–34.0)
MCHC: 34.4 g/dL (ref 30.0–36.0)
MCV: 87.6 fL (ref 78.0–100.0)
MONOS PCT: 6 %
Monocytes Absolute: 0.5 10*3/uL (ref 0.1–1.0)
NEUTROS ABS: 6 10*3/uL (ref 1.7–7.7)
NEUTROS PCT: 73 %
Platelets: 361 10*3/uL (ref 150–400)
RBC: 4.44 MIL/uL (ref 4.22–5.81)
RDW: 12.9 % (ref 11.5–15.5)
WBC: 8.3 10*3/uL (ref 4.0–10.5)

## 2015-01-09 LAB — COMPREHENSIVE METABOLIC PANEL
ALBUMIN: 4.7 g/dL (ref 3.5–5.0)
ALK PHOS: 67 U/L (ref 38–126)
ALT: 21 U/L (ref 17–63)
ANION GAP: 7 (ref 5–15)
AST: 29 U/L (ref 15–41)
BILIRUBIN TOTAL: 0.7 mg/dL (ref 0.3–1.2)
BUN: 13 mg/dL (ref 6–20)
CALCIUM: 9.8 mg/dL (ref 8.9–10.3)
CO2: 28 mmol/L (ref 22–32)
CREATININE: 0.81 mg/dL (ref 0.61–1.24)
Chloride: 102 mmol/L (ref 101–111)
GFR calc Af Amer: >60 mL/min (ref >60)
GFR calc non Af Amer: >60 mL/min (ref >60)
GLUCOSE: 122 mg/dL — AB (ref 65–99)
Potassium: 3.7 mmol/L (ref 3.5–5.1)
Sodium: 137 mmol/L (ref 135–145)
TOTAL PROTEIN: 7.8 g/dL (ref 6.5–8.1)

## 2015-01-09 LAB — URINALYSIS, ROUTINE W REFLEX MICROSCOPIC
BILIRUBIN URINE: NEGATIVE
GLUCOSE, UA: NEGATIVE mg/dL
Hgb urine dipstick: NEGATIVE
KETONES UR: NEGATIVE mg/dL
Leukocytes, UA: NEGATIVE
Nitrite: NEGATIVE
PH: 7.5 (ref 5.0–8.0)
Protein, ur: NEGATIVE mg/dL
Specific Gravity, Urine: 1.016 (ref 1.005–1.030)
Urobilinogen, UA: 0.2 mg/dL (ref 0.0–1.0)

## 2015-01-09 MED ORDER — DIAZEPAM 5 MG/ML IJ SOLN
5.0000 mg | Freq: Once | INTRAMUSCULAR | Status: AC
  Administered 2015-01-09: 5 mg via INTRAVENOUS
  Filled 2015-01-09: qty 2

## 2015-01-09 MED ORDER — IOHEXOL 300 MG/ML  SOLN
100.0000 mL | Freq: Once | INTRAMUSCULAR | Status: AC | PRN
  Administered 2015-01-09: 100 mL via INTRAVENOUS

## 2015-01-09 MED ORDER — IBUPROFEN 800 MG PO TABS: 800.0000 mg | ORAL_TABLET | Freq: Three times a day (TID) | ORAL | Status: DC | PRN

## 2015-01-09 MED ORDER — KETOROLAC TROMETHAMINE 30 MG/ML IJ SOLN
30.0000 mg | Freq: Once | INTRAMUSCULAR | Status: AC
  Administered 2015-01-09: 30 mg via INTRAVENOUS
  Filled 2015-01-09: qty 1

## 2015-01-09 MED ORDER — MORPHINE SULFATE (PF) 4 MG/ML IV SOLN
4.0000 mg | Freq: Once | INTRAVENOUS | Status: AC
  Administered 2015-01-09: 4 mg via INTRAVENOUS
  Filled 2015-01-09: qty 1

## 2015-01-09 MED ORDER — DIAZEPAM 5 MG PO TABS: 5.0000 mg | ORAL_TABLET | Freq: Four times a day (QID) | ORAL | Status: DC | PRN

## 2015-01-09 NOTE — ED Notes (Signed)
Patient encouraged to stay, but [facility] to nurse first [facility]
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
## 2015-01-09 NOTE — ED Notes (Signed)
MD at bedside. 
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
## 2015-01-15 ENCOUNTER — Emergency Department (HOSPITAL_BASED_OUTPATIENT_CLINIC_OR_DEPARTMENT_OTHER)
Admission: EM | Admit: 2015-01-15 | Discharge: 2015-01-15 | Disposition: A | Discharge status: Home / Self Care | Payer: No Typology Code available for payment source | Attending: Emergency Medicine | Admitting: Emergency Medicine

## 2015-01-15 ENCOUNTER — Encounter (HOSPITAL_BASED_OUTPATIENT_CLINIC_OR_DEPARTMENT_OTHER): Payer: Self-pay

## 2015-01-15 DIAGNOSIS — F17210 Nicotine dependence, cigarettes, uncomplicated: Secondary | ICD-10-CM | POA: Diagnosis not present

## 2015-01-15 DIAGNOSIS — Z8719 Personal history of other diseases of the digestive system: Secondary | ICD-10-CM | POA: Diagnosis not present

## 2015-01-15 DIAGNOSIS — S0990XD Unspecified injury of head, subsequent encounter: Secondary | ICD-10-CM | POA: Insufficient documentation

## 2015-01-15 DIAGNOSIS — M419 Scoliosis, unspecified: Secondary | ICD-10-CM | POA: Diagnosis not present

## 2015-01-15 DIAGNOSIS — Z79899 Other long term (current) drug therapy: Secondary | ICD-10-CM | POA: Insufficient documentation

## 2015-01-15 DIAGNOSIS — S199XXD Unspecified injury of neck, subsequent encounter: Secondary | ICD-10-CM | POA: Diagnosis present

## 2015-01-15 DIAGNOSIS — G43909 Migraine, unspecified, not intractable, without status migrainosus: Secondary | ICD-10-CM | POA: Diagnosis not present

## 2015-01-15 DIAGNOSIS — M542 Cervicalgia: Secondary | ICD-10-CM

## 2015-01-15 DIAGNOSIS — F329 Major depressive disorder, single episode, unspecified: Secondary | ICD-10-CM | POA: Insufficient documentation

## 2015-01-15 DIAGNOSIS — S4992XD Unspecified injury of left shoulder and upper arm, subsequent encounter: Secondary | ICD-10-CM | POA: Diagnosis not present

## 2015-01-15 DIAGNOSIS — S4991XD Unspecified injury of right shoulder and upper arm, subsequent encounter: Secondary | ICD-10-CM | POA: Diagnosis not present

## 2015-01-15 DIAGNOSIS — F419 Anxiety disorder, unspecified: Secondary | ICD-10-CM | POA: Insufficient documentation

## 2015-01-15 MED ORDER — METHOCARBAMOL 500 MG PO TABS: 500.0000 mg | ORAL_TABLET | Freq: Two times a day (BID) | ORAL | Status: DC | PRN

## 2015-01-15 MED ORDER — IBUPROFEN 800 MG PO TABS
800.0000 mg | ORAL_TABLET | Freq: Once | ORAL | Status: AC
  Administered 2015-01-15: 800 mg via ORAL
  Filled 2015-01-15: qty 1

## 2015-01-15 MED ORDER — NAPROXEN 250 MG PO TABS: 250.0000 mg | ORAL_TABLET | Freq: Two times a day (BID) | ORAL | Status: DC
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
## 2015-03-30 DIAGNOSIS — G4733 Obstructive sleep apnea (adult) (pediatric): Secondary | ICD-10-CM | POA: Insufficient documentation

## 2015-03-30 DIAGNOSIS — K219 Gastro-esophageal reflux disease without esophagitis: Secondary | ICD-10-CM | POA: Insufficient documentation

## 2015-06-17 ENCOUNTER — Emergency Department (HOSPITAL_BASED_OUTPATIENT_CLINIC_OR_DEPARTMENT_OTHER)
Admission: EM | Admit: 2015-06-17 | Discharge: 2015-06-18 | Disposition: A | Discharge status: Home / Self Care | Payer: Self-pay | Attending: Emergency Medicine | Admitting: Emergency Medicine

## 2015-06-17 ENCOUNTER — Encounter (HOSPITAL_BASED_OUTPATIENT_CLINIC_OR_DEPARTMENT_OTHER): Payer: Self-pay | Admitting: Emergency Medicine

## 2015-06-17 DIAGNOSIS — F329 Major depressive disorder, single episode, unspecified: Secondary | ICD-10-CM | POA: Insufficient documentation

## 2015-06-17 DIAGNOSIS — R053 Chronic cough: Secondary | ICD-10-CM

## 2015-06-17 DIAGNOSIS — R55 Syncope and collapse: Secondary | ICD-10-CM | POA: Insufficient documentation

## 2015-06-17 DIAGNOSIS — F17290 Nicotine dependence, other tobacco product, uncomplicated: Secondary | ICD-10-CM | POA: Insufficient documentation

## 2015-06-17 DIAGNOSIS — R05 Cough: Secondary | ICD-10-CM | POA: Insufficient documentation

## 2015-06-17 DIAGNOSIS — R0789 Other chest pain: Secondary | ICD-10-CM | POA: Insufficient documentation

## 2015-06-17 NOTE — ED Provider Notes (Addendum)
CSN: [id number]     Arrival date & time 06/17/15  2345 History  By signing my name below, I, Iona Beard, attest that this documentation has been prepared under the direction and in the presence of Paula Libra, MD.   Electronically Signed: Iona Beard, ED Scribe. 06/18/2015. 12:00 AM  Chief Complaint  Patient presents with  . Chest Pain    The history is provided by the patient. No language interpreter was used.  HPI Comments: Carlos Christensen is a 45 y.o. male who presents to the Emergency Department complaining of a cough, ongoing for two months. He was given a steroid course, albuterol, and Tessalon Perles which did not provide any relief to his cough. His cough has worsened over the past several days. It is loud enough to keep his wife awake at night.  Pt reports associated syncope tonight 5 minutes after a severe coughing episode. He notes subsequent 7/10, well localized left parasternal chest pain worsened with deep breaths or palpation. He states his symptoms are similar to when he was diagnosed with pneumonia in the past. No other associated symptoms noted. Pt was diagnosed with a URI in February 2017. No other worsening or alleviating factors noted. Pt denies any other pertinent symptoms.   Past Medical History  Diagnosis Date  . Anxiety and depression   . Reflux   . Pain in joint, ankle and foot 10/19/2012  . Migraine   . Scoliosis   . Depression    Past Surgical History  Procedure Laterality Date  . Ankle surgery    . Foot surgery     History reviewed. No pertinent family history. Social History  Substance Use Topics  . Smoking status: Current Every Day Smoker    Types: Cigars  . Smokeless tobacco: Never Used  . Alcohol Use: No    Review of Systems  All other systems reviewed and are negative.   Allergies  Review of patient's allergies indicates no known allergies.  Home Medications   Prior to Admission medications   Medication Sig Start Date End Date  Taking? Authorizing Provider  diazepam (VALIUM) 5 MG tablet Take 1 tablet (5 mg total) by mouth every 6 (six) hours as needed for muscle spasms. 01/09/15   Leta Baptist, MD  ibuprofen (ADVIL,MOTRIN) 800 MG tablet Take 1 tablet (800 mg total) by mouth every 8 (eight) hours as needed. 01/09/15   Leta Baptist, MD  lamoTRIgine (LAMICTAL) 100 MG tablet Take 100 mg by mouth daily. Take 1/4 for two weeks then 1/2 tablet    Historical Provider, MD  methocarbamol (ROBAXIN) 500 MG tablet Take 1 tablet (500 mg total) by mouth 2 (two) times daily as needed for muscle spasms. 01/15/15   Everlene Farrier, PA-C  naproxen (NAPROSYN) 250 MG tablet Take 1 tablet (250 mg total) by mouth 2 (two) times daily with a meal. 01/15/15   Everlene Farrier, PA-C  traMADol (ULTRAM) 50 MG tablet Take 1 tablet (50 mg total) by mouth every 8 (eight) hours as needed. 09/19/14   Peyton Najjar, MD   BP 136/104 mmHg  Pulse 80  Temp(Src) 98.1 F (36.7 C) (Oral)  Resp 18  Ht  (1.702 m)  Wt 220 lb (99.791 kg)  BMI 34.45 kg/m2  SpO2 95% Physical Exam General: Well-developed, well-nourished male in no acute distress; appearance consistent with age of record HENT: normocephalic; atraumatic Eyes: pupils equal, round and reactive to light; extraocular muscles intact Neck: supple Heart: regular rate and rhythm; no murmurs,  rubs or gallops Lungs: clear to auscultation bilaterally Chest: left lower parasternal tenderness Abdomen: soft; nondistended; nontender; no masses or hepatosplenomegaly; bowel sounds present Extremities: No deformity; full range of motion; pulses normal Neurologic: Awake, alert and oriented; motor function intact in all extremities and symmetric; no facial droop Skin: Warm and dry Psychiatric: Normal mood and affect  ED Course  Procedures (including critical care time) DIAGNOSTIC STUDIES: Oxygen Saturation is 95% on RA, adequate by my interpretation.    COORDINATION OF CARE: 12:14 AM-Discussed  treatment plan which includes BMP, CBC, troponin I, urinalysis, and EKG with pt at bedside and pt agreed to plan.    EKG Interpretation  Date/Time:  Monday June 18 2015 00:02:00 EDT Ventricular Rate:  80 PR Interval:  126 QRS Duration: 98 QT Interval:  368 QTC Calculation: 424 R Axis:   10 Text Interpretation:  Normal sinus rhythm Moderate voltage criteria for LVH, may be normal variant T wave abnormality, consider inferolateral ischemia Abnormal ECG No significant change was found Confirmed by Read Drivers  MD, Jonny Ruiz ([id number]) on 06/18/2015 12:05:34 AM       MDM   Nursing notes and vitals signs, including pulse oximetry, reviewed.  Summary of this visit's results, reviewed by myself:  Labs:  Results for orders placed or performed during the hospital encounter of 06/17/15 (from the past 24 hour(s))  Basic metabolic panel     Status: None   Collection Time: 06/18/15 12:15 AM  Result Value Ref Range   Sodium 140 135 - 145 mmol/L   Potassium 4.2 3.5 - 5.1 mmol/L   Chloride 107 101 - 111 mmol/L   CO2 27 22 - 32 mmol/L   Glucose, Bld 99 65 - 99 mg/dL   BUN 13 6 - 20 mg/dL   Creatinine, Ser 6.04 0.61 - 1.24 mg/dL   Calcium 9.3 8.9 - 54.0 mg/dL   GFR calc non Af Amer >60 >60 mL/min   GFR calc Af Amer >60 >60 mL/min   Anion gap 6 5 - 15  CBC     Status: Abnormal   Collection Time: 06/18/15 12:15 AM  Result Value Ref Range   WBC 8.9 4.0 - 10.5 K/uL   RBC 4.22 4.22 - 5.81 MIL/uL   Hemoglobin 13.0 13.0 - 17.0 g/dL   HCT 98.1 (L) 19.1 - 47.8 %   MCV 90.5 78.0 - 100.0 fL   MCH 30.8 26.0 - 34.0 pg   MCHC 34.0 30.0 - 36.0 g/dL   RDW 29.5 62.1 - 30.8 %   Platelets 315 150 - 400 K/uL  Troponin I     Status: None   Collection Time: 06/18/15 12:15 AM  Result Value Ref Range   Troponin I <0.03 <0.031 ng/mL  Urinalysis, Routine w reflex microscopic (not at [facility])     Status: None   Collection Time: 06/18/15 12:40 AM  Result Value Ref Range   Color, Urine YELLOW YELLOW   APPearance CLEAR  CLEAR   Specific Gravity, Urine 1.015 1.005 - 1.030   pH 6.0 5.0 - 8.0   Glucose, UA NEGATIVE NEGATIVE mg/dL   Hgb urine dipstick NEGATIVE NEGATIVE   Bilirubin Urine NEGATIVE NEGATIVE   Ketones, ur NEGATIVE NEGATIVE mg/dL   Protein, ur NEGATIVE NEGATIVE mg/dL   Nitrite NEGATIVE NEGATIVE   Leukocytes, UA NEGATIVE NEGATIVE    Imaging Studies: Dg Chest 2 View  06/18/2015  CLINICAL DATA:  Cough for several months. Previously seen by primary care physician and given multiple medications without relief. Chest pain  for 3 days. Possibly passed out today. EXAM: CHEST  2 VIEW COMPARISON:  12/09/2015 FINDINGS: Normal heart size and pulmonary vascularity. No focal airspace disease or consolidation in the lungs. No blunting of costophrenic angles. No pneumothorax. Mediastinal contours appear intact. Thoracolumbar scoliosis. Old right rib fractures. IMPRESSION: No active cardiopulmonary disease. Electronically Signed   By: Burman NievesWilliam  Stevens M.D.   On: 06/18/2015 01:22   2:47 AM Chest wall pain and tenderness significantly improved after IV Toradol.Patient states she needs to leave and is refusing a second troponin at this time. The cause of the syncope is unclear but could represent a cough induced vasovagal response.Will refer to [facility] pulmonology for his persistent cough.    I personally performed the services described in this documentation, which was scribed in my presence. The recorded information has been reviewed and is accurate.   Paula LibraJohn Bina Veenstra, MD 06/18/15 [id number]  Paula LibraJohn Ivett Luebbe, MD 06/18/15 [id number]

## 2015-06-17 NOTE — ED Notes (Signed)
Pt states this feels like the last time he had pneumonia.

## 2015-06-17 NOTE — ED Notes (Addendum)
Pt in c/o cough x several months. Has been seen by PMD and given multiple meds but still has cough. Also states that his chest has been hurting x 3 days. States this evening he woke up on the ground and believes he passed out, when he woke he had sever chest pain with radiation to L shoulder. Pt drove himself here.

## 2015-06-18 ENCOUNTER — Emergency Department (HOSPITAL_BASED_OUTPATIENT_CLINIC_OR_DEPARTMENT_OTHER): Payer: Self-pay

## 2015-06-18 LAB — URINALYSIS, ROUTINE W REFLEX MICROSCOPIC
Bilirubin Urine: NEGATIVE
GLUCOSE, UA: NEGATIVE mg/dL
Hgb urine dipstick: NEGATIVE
KETONES UR: NEGATIVE mg/dL
LEUKOCYTES UA: NEGATIVE
NITRITE: NEGATIVE
PROTEIN: NEGATIVE mg/dL
Specific Gravity, Urine: 1.015 (ref 1.005–1.030)
pH: 6 (ref 5.0–8.0)

## 2015-06-18 LAB — CBC
HCT: 38.2 % — ABNORMAL LOW (ref 39.0–52.0)
HEMOGLOBIN: 13 g/dL (ref 13.0–17.0)
MCH: 30.8 pg (ref 26.0–34.0)
MCHC: 34 g/dL (ref 30.0–36.0)
MCV: 90.5 fL (ref 78.0–100.0)
PLATELETS: 315 10*3/uL (ref 150–400)
RBC: 4.22 MIL/uL (ref 4.22–5.81)
RDW: 13.2 % (ref 11.5–15.5)
WBC: 8.9 10*3/uL (ref 4.0–10.5)

## 2015-06-18 LAB — BASIC METABOLIC PANEL
Anion gap: 6 (ref 5–15)
BUN: 13 mg/dL (ref 6–20)
CHLORIDE: 107 mmol/L (ref 101–111)
CO2: 27 mmol/L (ref 22–32)
Calcium: 9.3 mg/dL (ref 8.9–10.3)
Creatinine, Ser: 0.83 mg/dL (ref 0.61–1.24)
GFR calc Af Amer: >60 mL/min (ref >60)
GFR calc non Af Amer: >60 mL/min (ref >60)
GLUCOSE: 99 mg/dL (ref 65–99)
POTASSIUM: 4.2 mmol/L (ref 3.5–5.1)
SODIUM: 140 mmol/L (ref 135–145)

## 2015-06-18 LAB — TROPONIN I

## 2015-06-18 MED ORDER — KETOROLAC TROMETHAMINE 15 MG/ML IJ SOLN
15.0000 mg | Freq: Once | INTRAMUSCULAR | Status: AC
  Administered 2015-06-18: 15 mg via INTRAVENOUS
  Filled 2015-06-18: qty 1

## 2015-06-18 NOTE — ED Notes (Signed)
Pt declined repeat troponin lab draw, states, wife has to go to work, ready to go, feel better, cough still present".  EDP notified.

## 2015-06-18 NOTE — ED Notes (Signed)
Transported to xray 

## 2016-01-10 DIAGNOSIS — J45901 Unspecified asthma with (acute) exacerbation: Secondary | ICD-10-CM | POA: Insufficient documentation

## 2017-03-19 DIAGNOSIS — M7582 Other shoulder lesions, left shoulder: Secondary | ICD-10-CM | POA: Diagnosis not present

## 2017-03-19 DIAGNOSIS — G8929 Other chronic pain: Secondary | ICD-10-CM | POA: Diagnosis not present

## 2017-03-19 DIAGNOSIS — M25512 Pain in left shoulder: Secondary | ICD-10-CM | POA: Diagnosis not present

## 2017-03-23 ENCOUNTER — Other Ambulatory Visit: Payer: Self-pay

## 2017-03-23 ENCOUNTER — Emergency Department (HOSPITAL_BASED_OUTPATIENT_CLINIC_OR_DEPARTMENT_OTHER): Payer: BLUE CROSS/BLUE SHIELD

## 2017-03-23 ENCOUNTER — Encounter (HOSPITAL_BASED_OUTPATIENT_CLINIC_OR_DEPARTMENT_OTHER): Payer: Self-pay

## 2017-03-23 ENCOUNTER — Emergency Department (HOSPITAL_BASED_OUTPATIENT_CLINIC_OR_DEPARTMENT_OTHER)
Admission: EM | Admit: 2017-03-23 | Discharge: 2017-03-23 | Disposition: A | Discharge status: Home / Self Care | Payer: BLUE CROSS/BLUE SHIELD | Attending: Emergency Medicine | Admitting: Emergency Medicine

## 2017-03-23 DIAGNOSIS — M25561 Pain in right knee: Secondary | ICD-10-CM

## 2017-03-23 DIAGNOSIS — Z79899 Other long term (current) drug therapy: Secondary | ICD-10-CM | POA: Insufficient documentation

## 2017-03-23 DIAGNOSIS — Z87891 Personal history of nicotine dependence: Secondary | ICD-10-CM | POA: Diagnosis not present

## 2017-03-23 HISTORY — DX: Chronic pain syndrome: G89.4

## 2017-03-23 MED ORDER — KETOROLAC TROMETHAMINE 30 MG/ML IJ SOLN
30.0000 mg | Freq: Once | INTRAMUSCULAR | Status: AC
  Administered 2017-03-23: 30 mg via INTRAVENOUS
  Filled 2017-03-23: qty 1

## 2017-03-23 MED ORDER — IBUPROFEN 600 MG PO TABS: 600.0000 mg | ORAL_TABLET | Freq: Four times a day (QID) | ORAL | 0 refills | Status: DC | PRN

## 2017-03-23 NOTE — ED Triage Notes (Signed)
Pt states he felt a pop right knee last night while getting into bed-no direct trauma-NAD-presents to triage in w/c

## 2017-03-23 NOTE — Discharge Instructions (Signed)
It was my pleasure taking care of you today!   Please see the information and instructions below regarding your visit.  Your diagnoses today include:  1. Acute pain of right knee     Tests performed today include: See side panel of your discharge paperwork for testing performed today. Vital signs are listed at the bottom of these instructions.   X-ray of the right knee is negative for effusion.  Medications prescribed:    Take any prescribed medications only as prescribed, and any over the counter medications only as directed on the packaging.  Ibuprofen/Tylenol as needed for pain. Use crutches as needed for comfort. Ice and elevate knee throughout the day.  Home care instructions:  Please follow any educational materials contained in this packet.   Follow-up instructions: Please follow-up with your primary care provider in as soon as possible for further evaluation of your symptoms if they are not completely improved.   Call the orthopedist listed today or tomorrow to schedule follow up appointment for recheck of ongoing knee pain in one to two weeks. That appointment can be canceled with a 24-48 hour notice if complete resolution of pain.   Follow up with the orthopedist on Thursday.  Return instructions:  Please return to the Emergency Department if you experience worsening symptoms.  Please return for any increasing swelling, increasing pain, loss of color to the lower leg, or increasing redness. Please return if you have any other emergent concerns.  Additional Information:   Your vital signs today were: BP 139/86 (BP Location: Right Arm)    Pulse 69    Temp 98.2 F (36.8 C) (Oral)    Resp 16    Ht 5\' 7"  (1.702 m)    Wt 96.2 kg (212 lb)    SpO2 100%    BMI 33.20 kg/m  If your blood pressure (BP) was elevated on multiple readings during this visit above 130 for the top number or above 80 for the bottom number, please have this repeated by your primary care provider within  one month. --------------

## 2017-03-23 NOTE — ED Provider Notes (Signed)
[facility] EMERGENCY DEPARTMENT Provider Note   CSN: [id number] Arrival date & time: 03/23/17  1944     History   Chief Complaint Chief Complaint  Patient presents with  . Knee Pain    HPI Ewell Benassi is a 47 y.o. male.  HPI   Patient is a 47 year old male with a history of anxiety, depression, migraine, and bilateral ankle surgery presenting for acute pain in the right knee as he was lifting his leg up into the bed.  Patient did not have any injury with working out or other activity prior to the onset of this pain.  Patient did not notice erythema or swelling.  Patient reports that he was able to immediately walk on it, and also walked on it today.  Patient denies any distal paresthesias of increased today, numbness, weakness, or swelling distal to the knee.  Patient tried Tylenol last night without full relief.  Past Medical History:  Diagnosis Date  . Anxiety and depression   . Chronic pain syndrome   . Depression   . Migraine   . Pain in joint, ankle and foot 10/19/2012  . Reflux   . Scoliosis     Patient Active Problem List   Diagnosis Date Noted  . Sprain of ankle 09/19/2014  . Strain of Achilles tendon 09/19/2014  . Pain in joint, ankle and foot 10/19/2012  . Tendonitis of ankle 10/19/2012    Past Surgical History:  Procedure Laterality Date  . ANKLE SURGERY    . ARTHROSCOPIC REPAIR ACL    . FOOT SURGERY         Home Medications    Prior to Admission medications   Medication Sig Start Date End Date Taking? Authorizing Provider  lamoTRIgine (LAMICTAL) 100 MG tablet Take 100 mg by mouth daily. Take 1/4 for two weeks then 1/2 tablet    [provider]    Family History No family history on file.  Social History Social History   Tobacco Use  . Smoking status: Former Games developer  . Smokeless tobacco: Never Used  Substance Use Topics  . Alcohol use: No  . Drug use: No     Allergies   Patient has no known allergies.   Review  of Systems Review of Systems  Musculoskeletal: Positive for arthralgias. Negative for gait problem.  Skin: Negative for color change and wound.  Neurological: Negative for weakness and numbness.     Physical Exam Updated Vital Signs BP 139/86 (BP Location: Right Arm)   Pulse 69   Temp 98.2 F (36.8 C) (Oral)   Resp 16   Ht 5\' 7"  (1.702 m)   Wt 96.2 kg (212 lb)   SpO2 100%   BMI 33.20 kg/m   Physical Exam  Constitutional: He appears well-developed and well-nourished. No distress.  Sitting comfortably in bed.  HENT:  Head: Normocephalic and atraumatic.  Eyes: Conjunctivae are normal. Right eye exhibits no discharge. Left eye exhibits no discharge.  EOMs normal to gross examination.  Neck: Normal range of motion.  Cardiovascular: Normal rate and regular rhythm.  Intact, 2+ radial pulse.  Pulmonary/Chest:  Normal respiratory effort. Patient converses comfortably. No audible wheeze or stridor.  Abdominal: He exhibits no distension.  Musculoskeletal: Normal range of motion.  Right knee with tenderness to palpation of superomedial patella. Full ROM. No joint line tenderness. No joint effusion or swelling appreciated. No abnormal alignment or patellar mobility. No bruising, erythema or warmth overlaying the joint. No varus/valgus laxity. Negative drawer's, Lachman's and  McMurray's.  No crepitus.  2+ DP pulses bilaterally. All compartments are soft. Sensation intact distal to injury.   Neurological: He is alert.  Cranial nerves intact to gross observation. Patient moves extremities without difficulty.  Skin: Skin is warm and dry. He is not diaphoretic.  Psychiatric: He has a normal mood and affect. His behavior is normal. Judgment and thought content normal.  Nursing note and vitals reviewed.    ED Treatments / Results  Labs (all labs ordered are listed, but only abnormal results are displayed) Labs Reviewed - No data to display  EKG  EKG Interpretation None        Radiology Dg Knee Complete 4 Views Right  Result Date: 03/23/2017 CLINICAL DATA:  Knee pain medial aspect patella EXAM: RIGHT KNEE - COMPLETE 4+ VIEW COMPARISON:  None. FINDINGS: No fracture or malalignment. Joint spaces are maintained. No large effusion IMPRESSION: Negative. Electronically Signed   By: Jasmine PangKim  Fujinaga M.D.   On: 03/23/2017 20:58    Procedures Procedures (including critical care time)  Medications Ordered in ED Medications  ketorolac (TORADOL) 30 MG/ML injection 30 mg (30 mg Intravenous Given 03/23/17 2122)     Initial Impression / Assessment and Plan / ED Course  I have reviewed the triage vital signs and the nursing notes.  Pertinent labs & imaging results that were available during my care of the patient were reviewed by me and considered in my medical decision making (see chart for details).    Final Clinical Impressions(s) / ED Diagnoses   Final diagnoses:  Acute pain of right knee   Patient is nontoxic-appearing and in no acute distress.  The right knee does not demonstrate erythema or edema consistent with septic arthritis.  Patient is able to flex and extend the knee without difficulty.  There is no laxity of the MCL or LCL.  No evidence of ACL injury.  I discussed with this patient that there could be a meniscus injury and this will require orthopedic follow-up.  Patient already is seeing an orthopedic physician for his shoulder this coming Thursday, and I recommended that he also discussed the knee pain at this time.  Patient did have some posterior knee pain, however this was discussed with Dr. Carver Filailden the patient has no risk factors for DVT or PE.  History is not consistent, as this is sudden onset of pain.  There is no pallor distal to the right knee, and pulses are strong and equal bilaterally.  No evidence of acute arterial occlusion.  Patient will follow with orthopedics on Thursday.  Patient requested Toradol for analgesia.  Return precautions given  for any worsening swelling, erythema, distal pallor or paresthesias.  Patient is understanding and agrees with plan of care.  ED Discharge Orders    None       Delia ChimesMurray, Ignatz Deis B, PA-C 03/23/17 2158    Little, Ambrose Finlandachel Morgan, MD 03/24/17 [id number]

## 2017-03-26 DIAGNOSIS — M19012 Primary osteoarthritis, left shoulder: Secondary | ICD-10-CM | POA: Diagnosis not present

## 2017-03-26 DIAGNOSIS — M7582 Other shoulder lesions, left shoulder: Secondary | ICD-10-CM | POA: Diagnosis not present

## 2017-03-26 DIAGNOSIS — G8929 Other chronic pain: Secondary | ICD-10-CM | POA: Diagnosis not present

## 2017-04-13 IMAGING — MR MR ANKLE*L* W/O CM
3 of 6 series · 9 of 40 positions shown · non-contrast
Comparison: Plain films left ankle 09/12/2014.

CLINICAL DATA: The patient stepped in a hole left and twisted his
left ankle in September 2014. Left ankle pain since the incident.
Initial encounter.

EXAM:
MRI OF THE LEFT ANKLE WITHOUT CONTRAST
TECHNIQUE: Multiplanar, multisequence MR imaging of the ankle was performed. No
intravenous contrast was administered.

[Series 3: PD fat-sat · axial · left · 4.0mm · 0.20mm/px · z∈[-60,+31]mm · 3 of 24 slices shown]
[im 5/24]
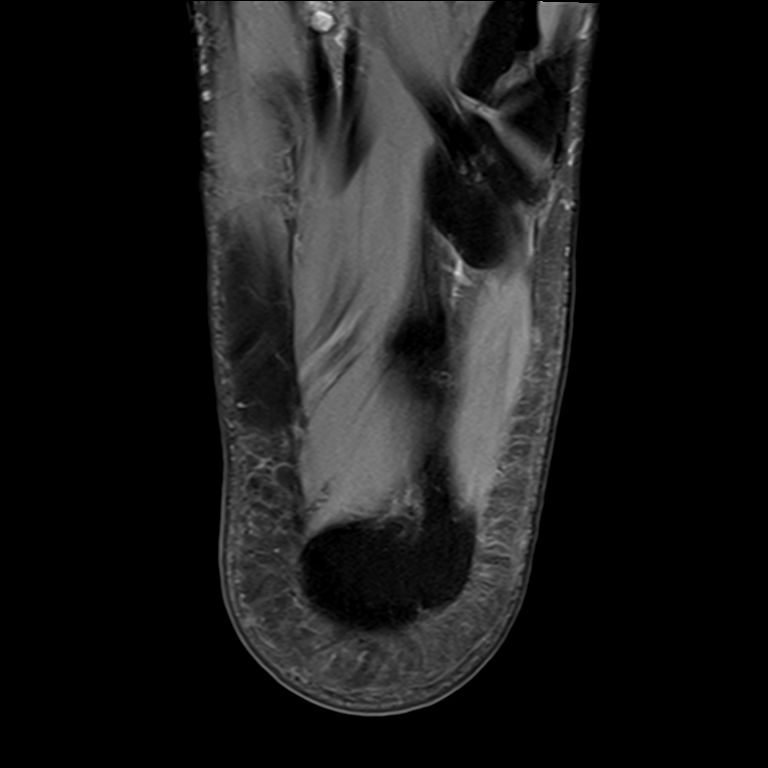
[im 14/24]
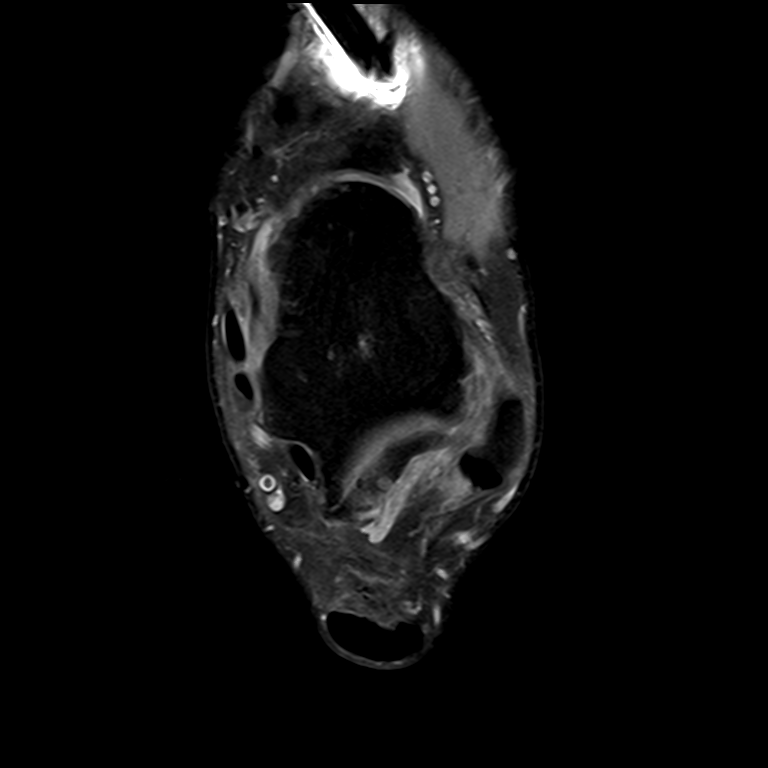
[im 24/24]
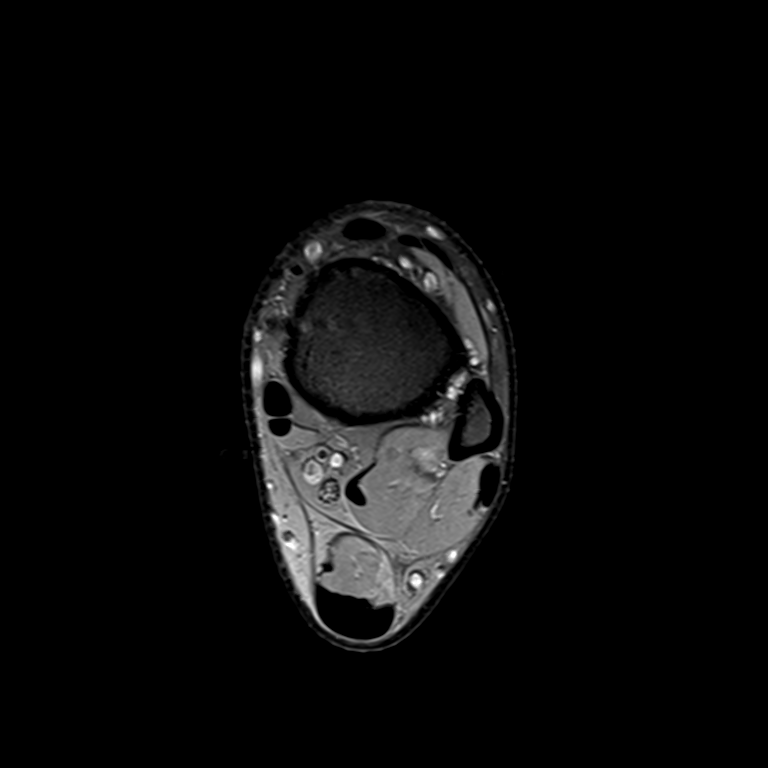

[Series 5: T2 fat-sat · axial · left · 4.0mm · 0.20mm/px · z∈[-60,+31]mm · 3 of 24 slices shown (1 of 2)]
[im 5/24]
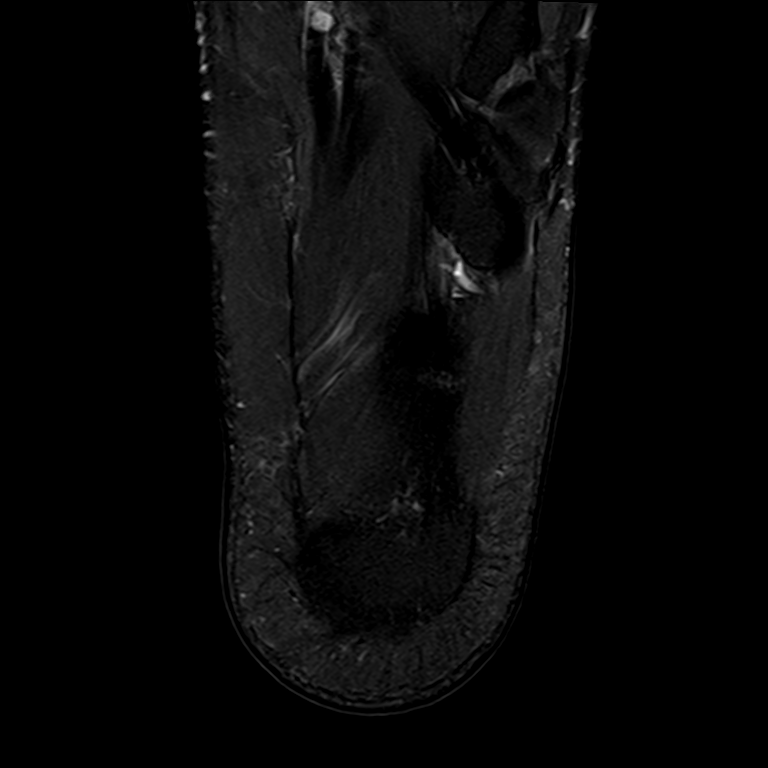
[im 14/24]
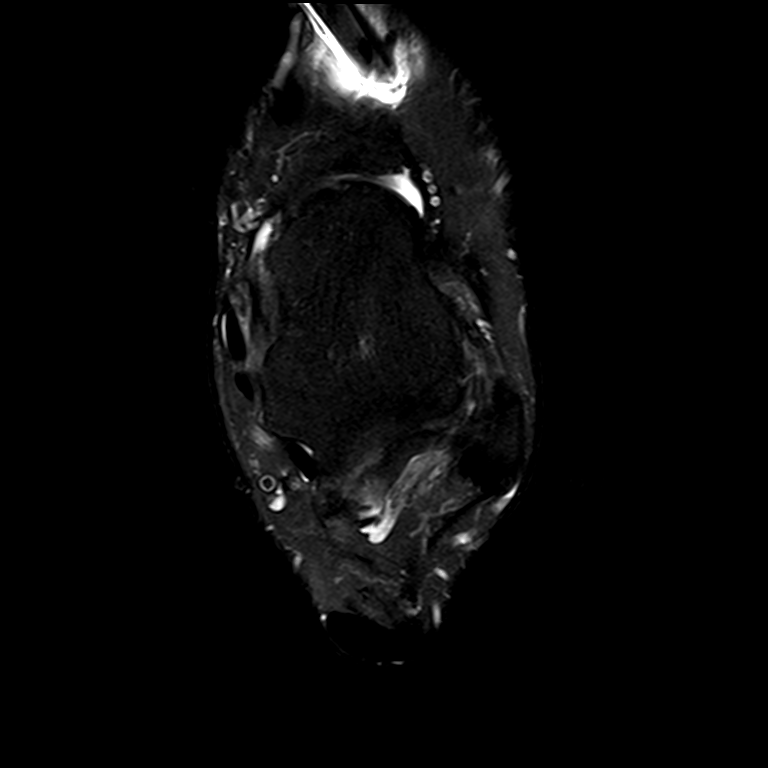
[im 24/24]
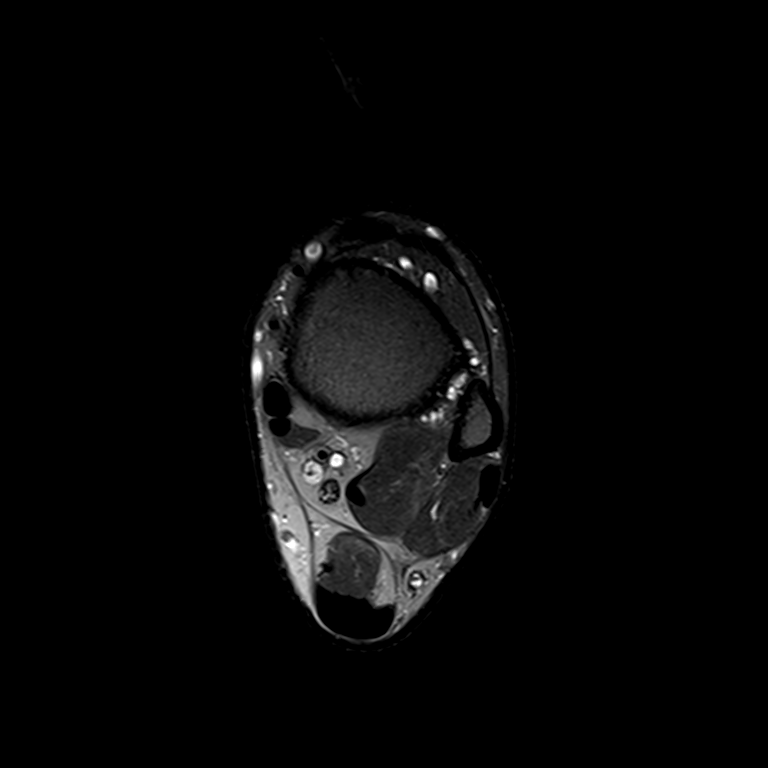

[Series 6: T2 fat-sat · sagittal · left · 3.0mm · 0.21mm/px · 3 of 23 slices shown (2 of 2)]
[im 5/23]
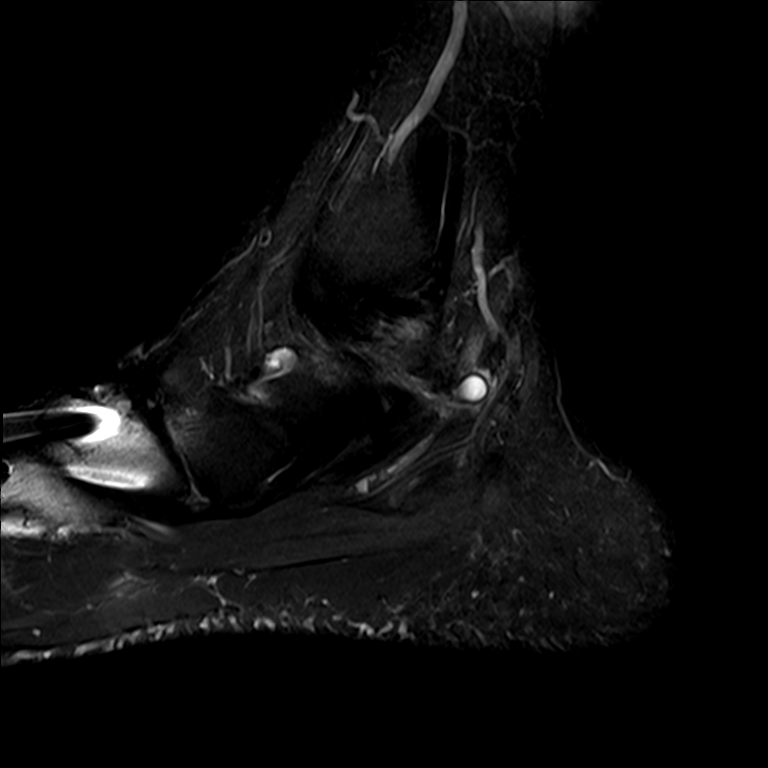
[im 14/23]
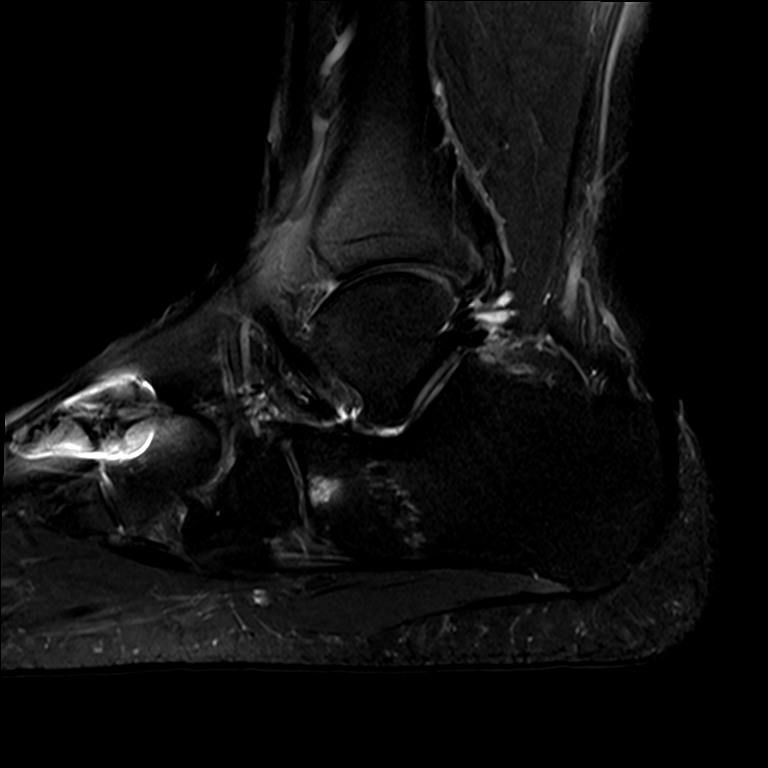
[im 23/23]
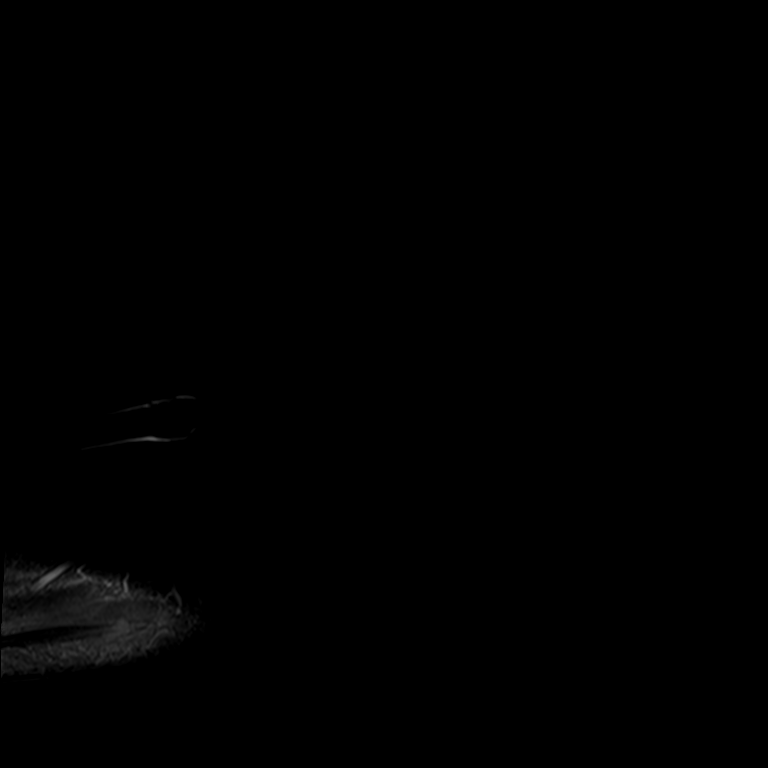

[9 of 40 positions shown; findings below may reference images not displayed]

FINDINGS: There is artifact about the first and second tarsometatarsal joints
secondary to fusion pins as seen on the comparison plain films.

TENDONS

Peroneal: Artifact about the peroneus longus just distal to the
peroneal tubercle of the calcaneus is consistent with postoperative
change. The tendons appear intact.

Posteromedial: Intact.

Anterior: Visualization is limited by artifact but no tear is seen.

Achilles: The tendon is mildly thickened distally compatible with
tendinosis without tear.

Plantar Fascia: Unremarkable.

LIGAMENTS

Lateral: Intact.

Medial: Intact.

CARTILAGE

Ankle Joint: Unremarkable.

Subtalar Joints/Sinus Tarsi: Unremarkable.

Bones: No fracture, stress change or worrisome marrow lesion.
Subchondral cyst formation in the calcaneus at the calcaneocuboid
joint is consistent with degenerative disease.
IMPRESSION: Mild thickening of the Achilles tendon compatible with tendinosis
without tear.

Mild degenerative disease about the calcaneocuboid joint.

Postoperative change as described above.

## 2017-05-10 ENCOUNTER — Encounter (HOSPITAL_BASED_OUTPATIENT_CLINIC_OR_DEPARTMENT_OTHER): Payer: Self-pay | Admitting: *Deleted

## 2017-05-10 ENCOUNTER — Other Ambulatory Visit: Payer: Self-pay

## 2017-05-10 DIAGNOSIS — Y92481 Parking lot as the place of occurrence of the external cause: Secondary | ICD-10-CM | POA: Insufficient documentation

## 2017-05-10 DIAGNOSIS — Y999 Unspecified external cause status: Secondary | ICD-10-CM | POA: Diagnosis not present

## 2017-05-10 DIAGNOSIS — Y9389 Activity, other specified: Secondary | ICD-10-CM | POA: Insufficient documentation

## 2017-05-10 DIAGNOSIS — Z87891 Personal history of nicotine dependence: Secondary | ICD-10-CM | POA: Diagnosis not present

## 2017-05-10 DIAGNOSIS — M545 Low back pain: Secondary | ICD-10-CM | POA: Diagnosis not present
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
## 2017-05-11 ENCOUNTER — Emergency Department (HOSPITAL_BASED_OUTPATIENT_CLINIC_OR_DEPARTMENT_OTHER)
Admission: EM | Admit: 2017-05-11 | Discharge: 2017-05-11 | Disposition: A | Discharge status: Home / Self Care | Payer: BLUE CROSS/BLUE SHIELD | Attending: Emergency Medicine | Admitting: Emergency Medicine

## 2017-05-11 DIAGNOSIS — M545 Low back pain, unspecified: Secondary | ICD-10-CM

## 2017-05-11 MED ORDER — CYCLOBENZAPRINE HCL 5 MG PO TABS: 5.0000 mg | ORAL_TABLET | Freq: Two times a day (BID) | ORAL | 0 refills | Status: DC | PRN

## 2017-05-11 MED ORDER — NAPROXEN 500 MG PO TABS: 500.0000 mg | ORAL_TABLET | Freq: Two times a day (BID) | ORAL | 0 refills | Status: DC

## 2017-05-11 MED ORDER — NAPROXEN 250 MG PO TABS
500.0000 mg | ORAL_TABLET | Freq: Once | ORAL | Status: AC
  Administered 2017-05-11: 500 mg via ORAL
  Filled 2017-05-11: qty 2

## 2017-05-11 NOTE — ED Notes (Signed)
Pt and wife given d/c instructions as per chart. Rx x 2 with precautions. Verbalizes understanding. No questions.
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
## 2017-05-11 NOTE — ED Notes (Signed)
ED Provider at bedside. 

## 2017-07-18 ENCOUNTER — Other Ambulatory Visit: Payer: Self-pay

## 2017-07-18 ENCOUNTER — Emergency Department (HOSPITAL_BASED_OUTPATIENT_CLINIC_OR_DEPARTMENT_OTHER): Payer: BLUE CROSS/BLUE SHIELD

## 2017-07-18 ENCOUNTER — Emergency Department (HOSPITAL_BASED_OUTPATIENT_CLINIC_OR_DEPARTMENT_OTHER)
Admission: EM | Admit: 2017-07-18 | Discharge: 2017-07-18 | Disposition: A | Discharge status: Home / Self Care | Payer: BLUE CROSS/BLUE SHIELD | Attending: Emergency Medicine | Admitting: Emergency Medicine

## 2017-07-18 ENCOUNTER — Encounter (HOSPITAL_BASED_OUTPATIENT_CLINIC_OR_DEPARTMENT_OTHER): Payer: Self-pay | Admitting: Emergency Medicine

## 2017-07-18 DIAGNOSIS — G894 Chronic pain syndrome: Secondary | ICD-10-CM | POA: Insufficient documentation

## 2017-07-18 DIAGNOSIS — S93401A Sprain of unspecified ligament of right ankle, initial encounter: Secondary | ICD-10-CM | POA: Diagnosis not present

## 2017-07-18 DIAGNOSIS — Z87891 Personal history of nicotine dependence: Secondary | ICD-10-CM | POA: Insufficient documentation

## 2017-07-18 DIAGNOSIS — M25571 Pain in right ankle and joints of right foot: Secondary | ICD-10-CM

## 2017-07-18 DIAGNOSIS — Z79899 Other long term (current) drug therapy: Secondary | ICD-10-CM | POA: Insufficient documentation

## 2017-07-18 DIAGNOSIS — S93601A Unspecified sprain of right foot, initial encounter: Secondary | ICD-10-CM | POA: Diagnosis not present

## 2017-07-18 DIAGNOSIS — S99911A Unspecified injury of right ankle, initial encounter: Secondary | ICD-10-CM | POA: Diagnosis not present

## 2017-07-18 MED ORDER — HYDROCODONE-ACETAMINOPHEN 5-325 MG PO TABS
1.0000 | ORAL_TABLET | Freq: Once | ORAL | Status: AC
  Administered 2017-07-18: 1 via ORAL
  Filled 2017-07-18: qty 1

## 2017-07-18 MED ORDER — NAPROXEN 375 MG PO TABS: 375.0000 mg | ORAL_TABLET | Freq: Two times a day (BID) | ORAL | 0 refills | Status: DC

## 2017-07-18 NOTE — ED Triage Notes (Signed)
R ankle pain today. States he stepped off the curb wrong.

## 2017-07-18 NOTE — Discharge Instructions (Addendum)
Your images are reassuring.  This may be a sprain.  I would wear the immobilizer for comfort.  Use the patches for weightbearing as tolerated.  Follow-up with your podiatrist or your orthopedic doctor.  Return the ED with any worsening symptoms. Please rest, ice, compress and elevated the affected body part to help with swelling and pain. Please take the Naproxen as prescribed for pain. Do not take any additional NSAIDs including Motrin, Aleve, Ibuprofen, Advil.

## 2017-07-19 NOTE — ED Provider Notes (Signed)
[facility] EMERGENCY DEPARTMENT Provider Note   CSN: [id number] Arrival date & time: 07/18/17  1320     History   Chief Complaint Chief Complaint  Patient presents with  . Ankle Pain    HPI Nyree Yonker is a 47 y.o. male.  HPI 47 year old African-American male past medical history significant for chronic pain, anxiety, depression, prior ankle surgery presents to the emergency department today for evaluation of right ankle pain.  Patient stepped off the curb wrong and twisted his right ankle.  Patient has had limited weightbearing since the incident secondary to pain.  He reports the pain is in his foot and radiates up to his calf.  Patient denies any paresthesias or weakness.  He is not take anything for the pain prior to arrival.  Range of motion and ambulation makes the pain worse.  Denies head injury or LOC. Past Medical History:  Diagnosis Date  . Anxiety and depression   . Chronic pain syndrome   . Depression   . Migraine   . Pain in joint, ankle and foot 10/19/2012  . Reflux   . Scoliosis     Patient Active Problem List   Diagnosis Date Noted  . Sprain of ankle 09/19/2014  . Strain of Achilles tendon 09/19/2014  . Pain in joint, ankle and foot 10/19/2012  . Tendonitis of ankle 10/19/2012    Past Surgical History:  Procedure Laterality Date  . ANKLE SURGERY    . ARTHROSCOPIC REPAIR ACL    . FOOT SURGERY          Home Medications    Prior to Admission medications   Medication Sig Start Date End Date Taking? Authorizing Provider  cyclobenzaprine (FLEXERIL) 5 MG tablet Take 1 tablet (5 mg total) by mouth 2 (two) times daily as needed for muscle spasms. 05/11/17   Horton, Mayer Masker, MD  ibuprofen (ADVIL,MOTRIN) 600 MG tablet Take 1 tablet (600 mg total) by mouth every 6 (six) hours as needed. 03/23/17   Aviva Kluver B, PA-C  lamoTRIgine (LAMICTAL) 100 MG tablet Take 100 mg by mouth daily. Take 1/4 for two weeks then 1/2 tablet    [provider]  naproxen (NAPROSYN) 375 MG tablet Take 1 tablet (375 mg total) by mouth 2 (two) times daily. 07/18/17   Rise Mu, PA-C    Family History No family history on file.  Social History Social History   Tobacco Use  . Smoking status: Former Games developer  . Smokeless tobacco: Never Used  Substance Use Topics  . Alcohol use: No  . Drug use: No     Allergies   Patient has no known allergies.   Review of Systems Review of Systems  Constitutional: Negative for fever.  Musculoskeletal: Positive for arthralgias and myalgias.  Skin: Positive for color change.  Neurological: Negative for weakness and numbness.     Physical Exam Updated Vital Signs BP 120/82 (BP Location: Left Arm)   Pulse 74   Temp 98.1 F (36.7 C) (Oral)   Resp 16   Ht  (1.702 m)   Wt 95.7 kg (211 lb)   SpO2 98%   BMI 33.05 kg/m   Physical Exam  Constitutional: He appears well-developed and well-nourished. No distress.  HENT:  Head: Normocephalic and atraumatic.  Eyes: Right eye exhibits no discharge. Left eye exhibits no discharge. No scleral icterus.  Neck: Normal range of motion.  Pulmonary/Chest: No respiratory distress.  Musculoskeletal:       Right ankle: He  exhibits decreased range of motion, swelling and ecchymosis. He exhibits no deformity, no laceration and normal pulse. Tenderness. Lateral malleolus, medial malleolus and head of 5th metatarsal tenderness found.       Right foot: There is decreased range of motion, tenderness, bony tenderness and swelling. There is normal capillary refill, no crepitus, no deformity and no laceration.  Thompson test was negative.   Neurological: He is alert.  Skin: No pallor.  Psychiatric: His behavior is normal. Judgment and thought content normal.  Nursing note and vitals reviewed.    ED Treatments / Results  Labs (all labs ordered are listed, but only abnormal results are displayed) Labs Reviewed - No data to  display  EKG None  Radiology Dg Tibia/fibula Right  Result Date: 07/18/2017 CLINICAL DATA:  Right ankle injury. EXAM: RIGHT TIBIA AND FIBULA - 2 VIEW COMPARISON:  Right knee x-rays dated March 23, 2017. Right foot x-rays dated August 28, 2014. Right ankle x-rays dated March 13th 1,011. FINDINGS: There is no evidence of fracture or other focal bone lesions. Well corticated ossific density at the tip of the medial malleolus is unchanged and likely due to prior trauma. Unchanged first tarsometatarsal arthrodesis and hindfoot screw. Soft tissues are unremarkable. IMPRESSION: Negative. Electronically Signed   By: Obie Dredge M.D.   On: 07/18/2017 14:13   Dg Ankle Complete Right  Result Date: 07/18/2017 CLINICAL DATA:  Rolled right foot and ankle off curb today with pain. EXAM: RIGHT ANKLE - COMPLETE 3+ VIEW COMPARISON:  None. FINDINGS: Ankle mortise is normal. There is no acute fracture or dislocation. With few screw present over the subtalar region. Mild degenerate change over the midfoot. IMPRESSION: No acute findings. Electronically Signed   By: Elberta Fortis M.D.   On: 07/18/2017 16:08   Dg Foot Complete Right  Result Date: 07/18/2017 CLINICAL DATA:  Rolled right foot and ankle stepping off curb today. Pain. EXAM: RIGHT FOOT COMPLETE - 3+ VIEW COMPARISON:  08/28/2014 FINDINGS: Two orthopedic screws intact unchanged over the first tarsometatarsal joint. With feeding screw the subtalar joint unchanged. Mild degenerate change of the talonavicular joint. No acute fracture or dislocation. IMPRESSION: No acute findings. Electronically Signed   By: Elberta Fortis M.D.   On: 07/18/2017 16:09    Procedures Procedures (including critical care time)  Medications Ordered in ED Medications  HYDROcodone-acetaminophen (NORCO/VICODIN) 5-325 MG per tablet 1 tablet (1 tablet Oral Given 07/18/17 1532)     Initial Impression / Assessment and Plan / ED Course  I have reviewed the triage vital signs and the  nursing notes.  Pertinent labs & imaging results that were available during my care of the patient were reviewed by me and considered in my medical decision making (see chart for details).     Patient X-Ray negative for obvious fracture or dislocation.  Neurovascularly intact.  Patient has no signs of Achilles rupture however he does have history of Achilles sprain.  pain managed in ED. Pt advised to follow up with orthopedics if symptoms persist for possibility of missed fracture diagnosis. Patient given ankle brace while in ED, conservative therapy recommended and discussed. Patient will be dc home & is agreeable with above plan. Seen by my attending who is agreeable with the above plna.    Final Clinical Impressions(s) / ED Diagnoses   Final diagnoses:  Acute right ankle pain    ED Discharge Orders        Ordered    naproxen (NAPROSYN) 375 MG tablet  2 times  daily     07/18/17 1700       Wallace Keller 07/19/17 1610    Raeford Razor, MD 07/19/17 1622

## 2017-07-24 ENCOUNTER — Ambulatory Visit (INDEPENDENT_AMBULATORY_CARE_PROVIDER_SITE_OTHER): Payer: BLUE CROSS/BLUE SHIELD | Admitting: Family Medicine

## 2017-07-24 ENCOUNTER — Encounter: Payer: Self-pay | Admitting: Family Medicine

## 2017-07-24 DIAGNOSIS — S99911A Unspecified injury of right ankle, initial encounter: Secondary | ICD-10-CM

## 2017-07-24 MED ORDER — HYDROCODONE-ACETAMINOPHEN 5-325 MG PO TABS: 1.0000 | ORAL_TABLET | Freq: Four times a day (QID) | ORAL | 0 refills | Status: DC | PRN

## 2017-07-24 NOTE — Patient Instructions (Signed)
You have an ankle sprain and posterior tibialis tendon strain. Ice the area for 15 minutes at a time, 3-4 times a day Aleve 2 tabs twice a day with food OR ibuprofen 3 tabs three times a day with food for pain and inflammation. Norco as needed for severe pain (each pill has 325 mg of tylenol in it.  Do NOT take more than  of tylenol in a day). Elevate above the level of your heart when possible Crutches if needed to help with walking Bear weight when tolerated Use laceup brace when up and walking around to help with stability while you recover from this injury. Come out of the brace twice a day to do Up/down and alphabet exercises 2-3 sets of each. Start theraband strengthening exercises when directed - once a day 3 sets of 10. Consider physical therapy for strengthening and balance exercises. If not improving as expected, we may repeat x-rays or consider further testing like an MRI. Follow up in 2 weeks.

## 2017-07-27 ENCOUNTER — Encounter: Payer: Self-pay | Admitting: Family Medicine

## 2017-07-27 DIAGNOSIS — S99911D Unspecified injury of right ankle, subsequent encounter: Secondary | ICD-10-CM | POA: Insufficient documentation

## 2017-07-27 NOTE — Progress Notes (Signed)
PCP: Raynelle Jan., MD  Subjective:   HPI: Patient is a 47 y.o. male here for right ankle injury.  Patient reports on 5/18 he accidentally inverted his right ankle. Immediate pain, swelling laterally and medially with difficulty bearing weight. Pain level still at 8/10 and sharp. Has been icing, elevating, taking tylenol, wearing ASO. He's had 4 different reconstructive surgeries on this ankle, 5 on the left ankle. No skin changes, numbness.  Past Medical History:  Diagnosis Date  . Anxiety and depression   . Chronic pain syndrome   . Depression   . Migraine   . Pain in joint, ankle and foot 10/19/2012  . Reflux   . Scoliosis     Current Outpatient Medications on File Prior to Visit  Medication Sig Dispense Refill  . albuterol (PROAIR HFA) 108 (90 Base) MCG/ACT inhaler INHALE TWO PUFFS BY MOUTH EVERY 6 HOURS AS NEEDED FOR WHEEZING    . lamoTRIgine (LAMICTAL) 100 MG tablet Take by mouth.    . naproxen (NAPROSYN) 375 MG tablet Take 1 tablet (375 mg total) by mouth 2 (two) times daily. 14 tablet 0   No current facility-administered medications on file prior to visit.     Past Surgical History:  Procedure Laterality Date  . ANKLE SURGERY    . ARTHROSCOPIC REPAIR ACL    . FOOT SURGERY      No Known Allergies  Social History   Socioeconomic History  . Marital status: Married    Spouse name: Not on file  . Number of children: Not on file  . Years of education: Not on file  . Highest education level: Not on file  Occupational History  . Not on file  Social Needs  . Financial resource strain: Not on file  . Food insecurity:    Worry: Not on file    Inability: Not on file  . Transportation needs:    Medical: Not on file    Non-medical: Not on file  Tobacco Use  . Smoking status: Former Games developer  . Smokeless tobacco: Never Used  Substance and Sexual Activity  . Alcohol use: No  . Drug use: No  . Sexual activity: Not on file  Lifestyle  . Physical activity:    Days per week: Not on file    Minutes per session: Not on file  . Stress: Not on file  Relationships  . Social connections:    Talks on phone: Not on file    Gets together: Not on file    Attends religious service: Not on file    Active member of club or organization: Not on file    Attends meetings of clubs or organizations: Not on file    Relationship status: Not on file  . Intimate partner violence:    Fear of current or ex partner: Not on file    Emotionally abused: Not on file    Physically abused: Not on file    Forced sexual activity: Not on file  Other Topics Concern  . Not on file  Social History Narrative  . Not on file    History reviewed. No pertinent family history.  BP 117/83   Pulse 78   Ht  (1.702 m)   Wt 212 lb (96.2 kg)   BMI 33.20 kg/m   Review of Systems: See HPI above.     Objective:  Physical Exam:  Gen: NAD, comfortable in exam room  Right ankle: Well healed surgical scars.  Mild swelling circumferentially without  bruising.  No other deformity. Mod limitation all directions with 5/5 strength but pain resisted IR, passive ER medially. TTP over ATFL and course of post tib tendon.  No other tenderness. 1+ ant drawer and talar tilt.   Negative syndesmotic compression. Thompsons test negative. NV intact distally.  Left ankle: No deformity. FROM with 5/5 strength. No tenderness to palpation. NVI distally.   Assessment & Plan:  1. Right ankle injury - independently reviewed radiographs and no acute bony abnormalities.  Brief MSK u/s without occult fracture and medial/lateral ankle tendons are intact.  Consistent with lateral ankle sprain and posterior tibialis tendon strain.  Icing, aleve or ibuprofen.  Norco as needed for severe pain.  Elevation, ASO.  Shown home exercises to do daily and how to advance these.  Consider physical therapy.  F/u in 2 weeks.

## 2017-07-27 NOTE — Assessment & Plan Note (Signed)
independently reviewed radiographs and no acute bony abnormalities.  Brief MSK u/s without occult fracture and medial/lateral ankle tendons are intact.  Consistent with lateral ankle sprain and posterior tibialis tendon strain.  Icing, aleve or ibuprofen.  Norco as needed for severe pain.  Elevation, ASO.  Shown home exercises to do daily and how to advance these.  Consider physical therapy.  F/u in 2 weeks.

## 2017-08-10 ENCOUNTER — Encounter: Payer: Self-pay | Admitting: Family Medicine

## 2017-08-10 ENCOUNTER — Ambulatory Visit (INDEPENDENT_AMBULATORY_CARE_PROVIDER_SITE_OTHER): Payer: BLUE CROSS/BLUE SHIELD | Admitting: Family Medicine

## 2017-08-10 DIAGNOSIS — S99911D Unspecified injury of right ankle, subsequent encounter: Secondary | ICD-10-CM | POA: Diagnosis not present

## 2017-08-10 NOTE — Patient Instructions (Signed)
You're improving as expected from your ankle sprain and posterior tibialis strain. You can stop the ankle brace now. Do home exercises with theraband 3 sets of 10 once a day - the red band should be sufficient for your ankle. Ice the area for 15 minutes at a time, 3-4 times a day as needed. Aleve 2 tabs twice a day with food OR ibuprofen 3 tabs three times a day with food for pain and inflammation as needed. Tylenol as needed for pain in addition to this. Make an appointment up front for your shoulder.

## 2017-08-11 ENCOUNTER — Encounter: Payer: Self-pay | Admitting: Family Medicine

## 2017-08-11 NOTE — Progress Notes (Signed)
PCP: Raynelle JanSpry, Heather M., MD  Subjective:   HPI: Patient is a 47 y.o. male here for right ankle injury.  5/24: Patient reports on 5/18 he accidentally inverted his right ankle. Immediate pain, swelling laterally and medially with difficulty bearing weight. Pain level still at 8/10 and sharp. Has been icing, elevating, taking tylenol, wearing ASO. He's had 4 different reconstructive surgeries on this ankle, 5 on the left ankle. No skin changes, numbness.  6/10: Patient returns reporting improvement since last visit. Foot throbs with shoe off. Taking tylenol, wearing ASO and doing home exercises. Has been elevating and icing also. Pain level 3/10, more dull currently. No skin changes.  Past Medical History:  Diagnosis Date  . Anxiety and depression   . Chronic pain syndrome   . Depression   . Migraine   . Pain in joint, ankle and foot 10/19/2012  . Reflux   . Scoliosis     Current Outpatient Medications on File Prior to Visit  Medication Sig Dispense Refill  . albuterol (PROAIR HFA) 108 (90 Base) MCG/ACT inhaler INHALE TWO PUFFS BY MOUTH EVERY 6 HOURS AS NEEDED FOR WHEEZING    . HYDROcodone-acetaminophen (NORCO) 5-325 MG tablet Take 1 tablet by mouth every 6 (six) hours as needed for moderate pain. 20 tablet 0  . lamoTRIgine (LAMICTAL) 100 MG tablet Take by mouth.    . naproxen (NAPROSYN) 375 MG tablet Take 1 tablet (375 mg total) by mouth 2 (two) times daily. 14 tablet 0   No current facility-administered medications on file prior to visit.     Past Surgical History:  Procedure Laterality Date  . ANKLE SURGERY    . ARTHROSCOPIC REPAIR ACL    . FOOT SURGERY      No Known Allergies  Social History   Socioeconomic History  . Marital status: Married    Spouse name: Not on file  . Number of children: Not on file  . Years of education: Not on file  . Highest education level: Not on file  Occupational History  . Not on file  Social Needs  . Financial resource  strain: Not on file  . Food insecurity:    Worry: Not on file    Inability: Not on file  . Transportation needs:    Medical: Not on file    Non-medical: Not on file  Tobacco Use  . Smoking status: Former Games developermoker  . Smokeless tobacco: Never Used  Substance and Sexual Activity  . Alcohol use: No  . Drug use: No  . Sexual activity: Not on file  Lifestyle  . Physical activity:    Days per week: Not on file    Minutes per session: Not on file  . Stress: Not on file  Relationships  . Social connections:    Talks on phone: Not on file    Gets together: Not on file    Attends religious service: Not on file    Active member of club or organization: Not on file    Attends meetings of clubs or organizations: Not on file    Relationship status: Not on file  . Intimate partner violence:    Fear of current or ex partner: Not on file    Emotionally abused: Not on file    Physically abused: Not on file    Forced sexual activity: Not on file  Other Topics Concern  . Not on file  Social History Narrative  . Not on file    History reviewed. No pertinent  family history.  BP 119/79   Pulse 67   Ht 5\' 7"  (1.702 m)   Wt 214 lb (97.1 kg)   BMI 33.52 kg/m   Review of Systems: See HPI above.     Objective:  Physical Exam:  Gen: NAD, comfortable in exam room  Right ankle: Well healed surgical scars.  No swelling, bruising, other deformity. Mild limitation all directions with 5/5 strength and pain on IR only.   TTP over post tib tendon and ATFL.  No other tenderness. Trace ant drawer and talar tilt.   Negative syndesmotic compression. Thompsons test negative. NV intact distally.  Left ankle: No deformity. FROM with 5/5 strength. No tenderness to palpation. NVI distally.   Assessment & Plan:  1. Right ankle injury - Radiographs negative.  MSK u/s reassuring as well.  Lateral ankle sprain improved though with some tenderness in this area.  Still with post tib tendon strain but  improvement in this as well.  Home exercises, icing, aleve or ibuprofen.  Tylenol if needed also.  Can stop using ASO.  Plans to f/u to discuss shoulder pain he's had for 6 months.

## 2017-08-11 NOTE — Assessment & Plan Note (Signed)
Radiographs negative.  MSK u/s reassuring as well.  Lateral ankle sprain improved though with some tenderness in this area.  Still with post tib tendon strain but improvement in this as well.  Home exercises, icing, aleve or ibuprofen.  Tylenol if needed also.  Can stop using ASO.  Plans to f/u to discuss shoulder pain he's had for 6 months.

## 2017-08-18 ENCOUNTER — Ambulatory Visit (INDEPENDENT_AMBULATORY_CARE_PROVIDER_SITE_OTHER): Payer: BLUE CROSS/BLUE SHIELD | Admitting: Family Medicine

## 2017-08-18 ENCOUNTER — Encounter: Payer: Self-pay | Admitting: Family Medicine

## 2017-08-18 DIAGNOSIS — M25512 Pain in left shoulder: Secondary | ICD-10-CM

## 2017-08-18 NOTE — Patient Instructions (Signed)
Your main problem is AC joint arthritis though you have mild impingement too. Consider cortisone injection - we can do this today (0.5:0.5) if you want this. Try to avoid painful activities (overhead activities, lifting with extended arm) as much as possible. Aleve 2 tabs twice a day with food OR ibuprofen 3 tabs three times a day with food for pain and inflammation. Can take tylenol in addition to this. Do home exercise program with theraband and scapular stabilization exercises daily 3 sets of 10 once a day only if they don't worsen your pain - if you get the shot, wait 5-7 days before starting these. Follow up with me in 6 weeks otherwise.

## 2017-08-20 ENCOUNTER — Encounter: Payer: Self-pay | Admitting: Family Medicine

## 2017-08-20 DIAGNOSIS — M25512 Pain in left shoulder: Secondary | ICD-10-CM | POA: Insufficient documentation

## 2017-08-20 NOTE — Assessment & Plan Note (Signed)
consistent primarily [facility] though he has some evidence of rotator cuff impingement.  We discussed aleve/ibuprofen, icing, reviewed home exercise program.  Offered AC injection which he declined today.  F/u in 6 weeks.

## 2017-08-20 NOTE — Progress Notes (Signed)
PCP: Raynelle Jan., MD  Subjective:   HPI: Patient is a 47 y.o. male here for left shoulder pain.  Patient reports remotely he had his left arm caught in a machine at work though improved following this. States without new injury he's had superolateral left shoulder pain. Pain level up to 4/10 and sharp. Cannot lie on his left side as it wakes him up. Taking acetaminophen which helps some. Tried toradol IM, mobic, diclofenac. Did exercises from his brother who is a Systems analyst. No skin changes, numbness.  Past Medical History:  Diagnosis Date  . Anxiety and depression   . Chronic pain syndrome   . Depression   . Migraine   . Pain in joint, ankle and foot 10/19/2012  . Reflux   . Scoliosis     Current Outpatient Medications on File Prior to Visit  Medication Sig Dispense Refill  . albuterol (PROAIR HFA) 108 (90 Base) MCG/ACT inhaler INHALE TWO PUFFS BY MOUTH EVERY 6 HOURS AS NEEDED FOR WHEEZING    . HYDROcodone-acetaminophen (NORCO) 5-325 MG tablet Take 1 tablet by mouth every 6 (six) hours as needed for moderate pain. 20 tablet 0  . lamoTRIgine (LAMICTAL) 100 MG tablet Take by mouth.    . naproxen (NAPROSYN) 375 MG tablet Take 1 tablet (375 mg total) by mouth 2 (two) times daily. 14 tablet 0   No current facility-administered medications on file prior to visit.     Past Surgical History:  Procedure Laterality Date  . ANKLE SURGERY    . ARTHROSCOPIC REPAIR ACL    . FOOT SURGERY      No Known Allergies  Social History   Socioeconomic History  . Marital status: Married    Spouse name: Not on file  . Number of children: Not on file  . Years of education: Not on file  . Highest education level: Not on file  Occupational History  . Not on file  Social Needs  . Financial resource strain: Not on file  . Food insecurity:    Worry: Not on file    Inability: Not on file  . Transportation needs:    Medical: Not on file    Non-medical: Not on file  Tobacco  Use  . Smoking status: Former Games developer  . Smokeless tobacco: Never Used  Substance and Sexual Activity  . Alcohol use: No  . Drug use: No  . Sexual activity: Not on file  Lifestyle  . Physical activity:    Days per week: Not on file    Minutes per session: Not on file  . Stress: Not on file  Relationships  . Social connections:    Talks on phone: Not on file    Gets together: Not on file    Attends religious service: Not on file    Active member of club or organization: Not on file    Attends meetings of clubs or organizations: Not on file    Relationship status: Not on file  . Intimate partner violence:    Fear of current or ex partner: Not on file    Emotionally abused: Not on file    Physically abused: Not on file    Forced sexual activity: Not on file  Other Topics Concern  . Not on file  Social History Narrative  . Not on file    History reviewed. No pertinent family history.  BP (!) 134/93   Pulse 70   Ht 5\' 7"  (1.702 m)   Wt 211  lb 6.4 oz (95.9 kg)   BMI 33.11 kg/m   Review of Systems: See HPI above.     Objective:  Physical Exam:  Gen: NAD, comfortable in exam room  Left shoulder: No swelling, ecchymoses.  No gross deformity. TTP AC joint.  No other tenderness. FROM. Positive Hawkins, negative Neers. Negative Yergasons. Strength 5/5 with empty can and resisted internal/external rotation. Negative apprehension. NV intact distally.  Right shoulder: No swelling, ecchymoses.  No gross deformity. No TTP. FROM. Strength 5/5 with empty can and resisted internal/external rotation. NV intact distally.   Assessment & Plan:  1. Left shoulder pain - consistent primarily with [facility] though he has some evidence of rotator cuff impingement.  We discussed aleve/ibuprofen, icing, reviewed home exercise program.  Offered AC injection which he declined today.  F/u in 6 weeks.

## 2017-08-27 ENCOUNTER — Ambulatory Visit: Payer: Self-pay | Admitting: Podiatry

## 2017-09-14 ENCOUNTER — Ambulatory Visit (INDEPENDENT_AMBULATORY_CARE_PROVIDER_SITE_OTHER): Payer: BLUE CROSS/BLUE SHIELD | Admitting: Podiatry

## 2017-09-14 ENCOUNTER — Encounter: Payer: Self-pay | Admitting: Podiatry

## 2017-09-14 DIAGNOSIS — M21962 Unspecified acquired deformity of left lower leg: Secondary | ICD-10-CM

## 2017-09-14 DIAGNOSIS — M21961 Unspecified acquired deformity of right lower leg: Secondary | ICD-10-CM

## 2017-09-14 DIAGNOSIS — M659 Synovitis and tenosynovitis, unspecified: Secondary | ICD-10-CM | POA: Diagnosis not present

## 2017-09-14 DIAGNOSIS — T85848A Pain due to other internal prosthetic devices, implants and grafts, initial encounter: Secondary | ICD-10-CM

## 2017-09-14 MED ORDER — IBUPROFEN 800 MG PO TABS: 800.0000 mg | ORAL_TABLET | Freq: Three times a day (TID) | ORAL | 1 refills | Status: DC | PRN

## 2017-09-14 MED ORDER — OXYCODONE-ACETAMINOPHEN 7.5-325 MG PO TABS: 1.0000 | ORAL_TABLET | Freq: Four times a day (QID) | ORAL | 0 refills | Status: DC | PRN

## 2017-09-14 NOTE — Patient Instructions (Signed)
Reviewed findings and available treatment options. May benefit from removing the STJ implant and further plantar flex the first ray. May require removal of screw(s) over the fusion site in order to do Cotton osteotomy. Surgery consent from reviewed for Removal of implant, STJ stent, screw(s), Cotton osteotomy with bone graft. As per request, pain medication prescribed.

## 2017-09-14 NOTE — Progress Notes (Signed)
SUBJECTIVE: 47 y.o. year old male presents for pain in right foot duration of several years. Pain in right foot, medial and lateral ankle near the heel area off and on for over a year. History of repeat injury on right ankle (rolled in) at work in Summer 2017 (?).  He was here last in 01/10/2013 for ankle pain . Surgical history over 15 years ago: Lapidus fusion, Evans osteotomy with bone graft, and STJ arthroereisis bilateral. Left foot STJ arthroereisis stent was removed subsequently due to intolerance.  Review of Systems  Constitutional: Negative.   HENT: Negative.   Eyes:       Stigmatism on both eyes.  Respiratory: Negative.   Cardiovascular: Negative.   Gastrointestinal: Negative.   Genitourinary: Negative.   Musculoskeletal:       History of bilateral flat foot surgery over 15 years ago.  Skin: Negative.      OBJECTIVE: DERMATOLOGIC EXAMINATION: Normal findings. Old scar from Lapidus, Evan, STJ procedure bilateral.  VASCULAR EXAMINATION OF LOWER LIMBS: All pedal pulses are palpable with normal pulsation.  Capillary Filling times within 3 seconds in all digits.  No edema or erythema noted. Temperature gradient from tibial crest to dorsum of foot is within normal bilateral.  NEUROLOGIC EXAMINATION OF THE LOWER LIMBS: All epicritic and tactile sensations grossly intact. Sharp and Dull discriminatory sensations at the plantar ball of hallux is intact bilateral.   MUSCULOSKELETAL EXAMINATION: Positive for forefoot varus on right. Post surgical fusion site first MCJ bilateral.  RADIOGRAPHIC STUDIES:  AP View:  Post surgical two screws crossing over the fusion site of the 1st MCJ on both feet. Right foot has Hyprocure implant in STJ. Left foot is absent. Increased CCJ lateral deviation angle on both feet noted.  ASSESSMENT: Intolerance to STJ arthroereisis implant right. Forefoot varus right. Right lateral and medial ankle pain.  PLAN: Reviewed findings and  available treatment options. May benefit from removing the STJ implant and further plantar flex the first ray. May require removal of screw(s) over the fusion site in order to do Cotton osteotomy. Surgery consent from reviewed for Removal of implant, STJ stent, screw(s), Cotton osteotomy with bone graft. As per request, pain medication prescribed.

## 2017-09-18 ENCOUNTER — Telehealth: Payer: Self-pay | Admitting: *Deleted

## 2017-09-18 NOTE — Telephone Encounter (Signed)
The pharmacy called wanting to verify dx codes and the reason why patient has received rx;s Apparently there is a trend within the family including other children and the mother in getting pain medications from ER

## 2017-11-03 ENCOUNTER — Ambulatory Visit: Payer: Self-pay | Admitting: Podiatry

## 2018-01-06 ENCOUNTER — Emergency Department (HOSPITAL_BASED_OUTPATIENT_CLINIC_OR_DEPARTMENT_OTHER): Payer: BLUE CROSS/BLUE SHIELD

## 2018-01-06 ENCOUNTER — Encounter (HOSPITAL_BASED_OUTPATIENT_CLINIC_OR_DEPARTMENT_OTHER): Payer: Self-pay

## 2018-01-06 ENCOUNTER — Emergency Department (HOSPITAL_BASED_OUTPATIENT_CLINIC_OR_DEPARTMENT_OTHER)
Admission: EM | Admit: 2018-01-06 | Discharge: 2018-01-06 | Disposition: A | Discharge status: Home / Self Care | Payer: BLUE CROSS/BLUE SHIELD | Attending: Emergency Medicine | Admitting: Emergency Medicine

## 2018-01-06 ENCOUNTER — Other Ambulatory Visit: Payer: Self-pay

## 2018-01-06 DIAGNOSIS — J45909 Unspecified asthma, uncomplicated: Secondary | ICD-10-CM | POA: Insufficient documentation

## 2018-01-06 DIAGNOSIS — Z79899 Other long term (current) drug therapy: Secondary | ICD-10-CM | POA: Diagnosis not present

## 2018-01-06 DIAGNOSIS — J209 Acute bronchitis, unspecified: Secondary | ICD-10-CM | POA: Diagnosis not present

## 2018-01-06 DIAGNOSIS — R05 Cough: Secondary | ICD-10-CM | POA: Diagnosis not present

## 2018-01-06 DIAGNOSIS — Z87891 Personal history of nicotine dependence: Secondary | ICD-10-CM | POA: Diagnosis not present

## 2018-01-06 DIAGNOSIS — R0602 Shortness of breath: Secondary | ICD-10-CM | POA: Diagnosis not present

## 2018-01-06 DIAGNOSIS — R062 Wheezing: Secondary | ICD-10-CM | POA: Diagnosis not present

## 2018-01-06 HISTORY — DX: Pneumonia, unspecified organism: J18.9

## 2018-01-06 MED ORDER — PREDNISONE 20 MG PO TABS: 60.0000 mg | ORAL_TABLET | Freq: Every day | ORAL | 0 refills | Status: AC

## 2018-01-06 MED ORDER — BALOXAVIR MARBOXIL(80 MG DOSE) 2 X 40 MG PO TBPK: 80.0000 mg | ORAL_TABLET | Freq: Once | ORAL | 0 refills | Status: AC

## 2018-01-06 MED ORDER — BENZONATATE 100 MG PO CAPS
200.0000 mg | ORAL_CAPSULE | Freq: Once | ORAL | Status: AC
Start: 2018-01-06 — End: 2018-01-06
  Administered 2018-01-06: 200 mg via ORAL
  Filled 2018-01-06: qty 2

## 2018-01-06 MED ORDER — BALOXAVIR MARBOXIL(80 MG DOSE) 2 X 40 MG PO TBPK: 80.0000 mg | ORAL_TABLET | Freq: Once | ORAL | 0 refills | Status: DC

## 2018-01-06 MED ORDER — PROMETHAZINE-DM 6.25-15 MG/5ML PO SYRP: 5.0000 mL | ORAL_SOLUTION | Freq: Every evening | ORAL | 0 refills | Status: DC | PRN

## 2018-01-06 MED ORDER — PREDNISONE 50 MG PO TABS
50.0000 mg | ORAL_TABLET | Freq: Once | ORAL | Status: AC
  Administered 2018-01-06: 50 mg via ORAL
  Filled 2018-01-06: qty 1

## 2018-01-06 NOTE — ED Notes (Signed)
ED Provider at bedside. 

## 2018-01-06 NOTE — Discharge Instructions (Addendum)
Take Xofluza as prescribed, this needs to be taken as soon as possible and within 48 hours of onset of symptoms. Take Prednisone as prescribed and complete the full course. Take Phenergan DM as needed as prescribed at night. Zyrtec D as needed as directed. Take Flonase daily. These are asthma control medications as well as used to treat respiratory colds.

## 2018-01-06 NOTE — ED Provider Notes (Signed)
[facility] EMERGENCY DEPARTMENT Provider Note   CSN: [id number] Arrival date & time: 01/06/18  2119     History   Chief Complaint Chief Complaint  Patient presents with  . Cough    HPI Carlos Christensen is a 47 y.o. male.  47 year old male presents with complaint of cough, body aches, vomiting.  Patient states that his symptoms started today.  Patient has a history of asthma, used a DuoNeb treatment prior to arrival which helped some.  Patient is concerned that he may have pneumonia.  Patient states that he had similar symptoms last year, treated as a viral upper respiratory infection and later developed pneumonia.  No prior hospitalizations related to asthma. Patient is a former smoker, does not vape. No other complaints or concerns.      Past Medical History:  Diagnosis Date  . Anxiety and depression   . Chronic pain syndrome   . Depression   . Migraine   . Pain in joint, ankle and foot 10/19/2012  . Pneumonia   . Reflux   . Scoliosis     Patient Active Problem List   Diagnosis Date Noted  . Left shoulder pain 08/20/2017  . Right ankle injury, subsequent encounter 07/27/2017  . Moderate asthma with acute exacerbation 01/10/2016  . Esophageal reflux 03/30/2015  . Obstructive sleep apnea 03/30/2015    Past Surgical History:  Procedure Laterality Date  . ANKLE SURGERY    . ARTHROSCOPIC REPAIR ACL    . FOOT SURGERY          Home Medications    Prior to Admission medications   Medication Sig Start Date End Date Taking? Authorizing Provider  albuterol (PROAIR HFA) 108 (90 Base) MCG/ACT inhaler INHALE TWO PUFFS BY MOUTH EVERY 6 HOURS AS NEEDED FOR WHEEZING 06/25/17   [provider]  Baloxavir Marboxil,80 MG Dose, (XOFLUZA) 2 x 40 MG TBPK Take 80 mg by mouth once for 1 dose. 01/06/18 01/06/18  Jeannie Fend, PA-C  HYDROcodone-acetaminophen (NORCO) 5-325 MG tablet Take 1 tablet by mouth every 6 (six) hours as needed for moderate pain. 07/24/17    Hudnall, Azucena Fallen, MD  ibuprofen (ADVIL,MOTRIN) 800 MG tablet Take 1 tablet (800 mg total) by mouth every 8 (eight) hours as needed. 09/14/17   Sheard, Myeong O, DPM  lamoTRIgine (LAMICTAL) 100 MG tablet Take by mouth. 11/22/15   [provider]  naproxen (NAPROSYN) 375 MG tablet Take 1 tablet (375 mg total) by mouth 2 (two) times daily. 07/18/17   Rise Mu, PA-C  oxyCODONE-acetaminophen (PERCOCET) 7.5-325 MG tablet Take 1 tablet by mouth every 6 (six) hours as needed. 09/14/17   Sheard, Myeong O, DPM  predniSONE (DELTASONE) 20 MG tablet Take 3 tablets (60 mg total) by mouth daily for 4 days. 01/06/18 01/10/18  Jeannie Fend, PA-C  promethazine-dextromethorphan (PROMETHAZINE-DM) 6.25-15 MG/5ML syrup Take 5 mLs by mouth at bedtime and may repeat dose one time if needed. 01/06/18   Jeannie Fend, PA-C    Family History No family history on file.  Social History Social History   Tobacco Use  . Smoking status: Former Games developer  . Smokeless tobacco: Never Used  Substance Use Topics  . Alcohol use: No  . Drug use: No     Allergies   Patient has no known allergies.   Review of Systems Review of Systems  Constitutional: Negative for chills, diaphoresis and fever.  HENT: Negative for congestion, ear pain, postnasal drip, sneezing and sore throat.  Eyes: Negative for discharge and redness.  Respiratory: Positive for cough, shortness of breath and wheezing.   Cardiovascular: Negative for chest pain.  Gastrointestinal: Positive for nausea and vomiting. Negative for abdominal pain, constipation and diarrhea.  Musculoskeletal: Positive for arthralgias and myalgias.  Skin: Negative for rash and wound.  Allergic/Immunologic: Negative for immunocompromised state.  Hematological: Negative for adenopathy.  Psychiatric/Behavioral: Negative for confusion.  All other systems reviewed and are negative.    Physical Exam Updated Vital Signs BP 124/85 (BP Location: Left Arm)    Pulse 95   Temp 99.6 F (37.6 C) (Oral)   Resp 18   Ht 5' 7.5" (1.715 m)   Wt 97.1 kg   SpO2 97%   BMI 33.02 kg/m   Physical Exam  Constitutional: He is oriented to person, place, and time. He appears well-developed and well-nourished.  Non-toxic appearance. He does not appear ill. No distress.  HENT:  Head: Normocephalic and atraumatic.  Right Ear: Hearing, tympanic membrane and ear canal normal. No middle ear effusion.  Left Ear: Hearing, tympanic membrane and ear canal normal.  No middle ear effusion.  Nose: Mucosal edema present.  Mouth/Throat: Uvula is midline, oropharynx is clear and moist and mucous membranes are normal. No uvula swelling. No oropharyngeal exudate, posterior oropharyngeal edema or posterior oropharyngeal erythema.  Eyes: Pupils are equal, round, and reactive to light. Conjunctivae are normal.  Neck: Neck supple.  Cardiovascular: Normal rate, regular rhythm, normal heart sounds and intact distal pulses.  No murmur heard. Pulmonary/Chest: Effort normal and breath sounds normal. He has no wheezes.  Lymphadenopathy:    He has no cervical adenopathy.  Neurological: He is alert and oriented to person, place, and time.  Skin: Skin is warm and dry. No rash noted.  Psychiatric: He has a normal mood and affect. His behavior is normal.  Nursing note and vitals reviewed.    ED Treatments / Results  Labs (all labs ordered are listed, but only abnormal results are displayed) Labs Reviewed - No data to display  EKG None  Radiology Dg Chest 2 View  Result Date: 01/06/2018 CLINICAL DATA:  Cough and flu symptoms. EXAM: CHEST - 2 VIEW COMPARISON:  Chest radiograph January 10, 2016 FINDINGS: Patient rotated LEFT accentuated RIGHT hilar markings. Mild bronchitic changes without pleural effusion or focal consolidation. No pneumothorax. Scoliosis. IMPRESSION: Mild bronchitic changes without focal consolidation. Electronically Signed   By: Awilda Metro M.D.   On:  01/06/2018 22:13    Procedures Procedures (including critical care time)  Medications Ordered in ED Medications  benzonatate (TESSALON) capsule 200 mg (200 mg Oral Given 01/06/18 2151)  predniSONE (DELTASONE) tablet 50 mg (50 mg Oral Given 01/06/18 2231)     Initial Impression / Assessment and Plan / ED Course  I have reviewed the triage vital signs and the nursing notes.  Pertinent labs & imaging results that were available during my care of the patient were reviewed by me and considered in my medical decision making (see chart for details).  Clinical Course as of Jan 06 2309  Wed Jan 06, 2018  2308 46yo with history of asthma, sudden onset cough/body aches/vomiting onset today. CXR shows bronchitis. Lungs clear on exam, patient had neb at home PTA. Patient treated with prednisone, phenergan DM for cough, rx for Xofluza due to onset of flu symptoms less than 48 hours ago. Also recommend Zyrtec, Flonase, recheck with PCP. Return to ER for worsening or concerning symptoms.   [LM]    Clinical Course  User Index [LM] Jeannie Fend, PA-C   Final Clinical Impressions(s) / ED Diagnoses   Final diagnoses:  Acute bronchitis    ED Discharge Orders         Ordered    predniSONE (DELTASONE) 20 MG tablet  Daily     01/06/18 2226    Baloxavir Marboxil,80 MG Dose, (XOFLUZA) 2 x 40 MG TBPK   Once,   Status:  Discontinued     01/06/18 2226    promethazine-dextromethorphan (PROMETHAZINE-DM) 6.25-15 MG/5ML syrup  at bedtime and repeat x1 PRN,   Status:  Discontinued     01/06/18 2226    Baloxavir Marboxil,80 MG Dose, (XOFLUZA) 2 x 40 MG TBPK   Once     01/06/18 2235    promethazine-dextromethorphan (PROMETHAZINE-DM) 6.25-15 MG/5ML syrup  at bedtime and repeat x1 PRN     01/06/18 2235           Jeannie Fend, PA-C 01/06/18 2310    Virgina Norfolk, DO 01/06/18 2331

## 2018-01-06 NOTE — ED Triage Notes (Signed)
C/o flu like sx x today-NAD-steady gait 

## 2018-01-18 DIAGNOSIS — R51 Headache: Secondary | ICD-10-CM | POA: Diagnosis not present

## 2018-02-15 DIAGNOSIS — S92354A Nondisplaced fracture of fifth metatarsal bone, right foot, initial encounter for closed fracture: Secondary | ICD-10-CM | POA: Diagnosis not present

## 2018-02-15 DIAGNOSIS — M79671 Pain in right foot: Secondary | ICD-10-CM | POA: Diagnosis not present

## 2018-02-15 DIAGNOSIS — S92901A Unspecified fracture of right foot, initial encounter for closed fracture: Secondary | ICD-10-CM | POA: Diagnosis not present

## 2018-02-15 DIAGNOSIS — S99921A Unspecified injury of right foot, initial encounter: Secondary | ICD-10-CM | POA: Diagnosis not present

## 2018-02-17 DIAGNOSIS — S92354A Nondisplaced fracture of fifth metatarsal bone, right foot, initial encounter for closed fracture: Secondary | ICD-10-CM | POA: Diagnosis not present

## 2018-03-08 DIAGNOSIS — S92354D Nondisplaced fracture of fifth metatarsal bone, right foot, subsequent encounter for fracture with routine healing: Secondary | ICD-10-CM | POA: Diagnosis not present

## 2018-03-08 DIAGNOSIS — J45901 Unspecified asthma with (acute) exacerbation: Secondary | ICD-10-CM | POA: Diagnosis not present

## 2018-03-08 DIAGNOSIS — J4520 Mild intermittent asthma, uncomplicated: Secondary | ICD-10-CM | POA: Diagnosis not present

## 2018-03-24 DIAGNOSIS — S92354D Nondisplaced fracture of fifth metatarsal bone, right foot, subsequent encounter for fracture with routine healing: Secondary | ICD-10-CM | POA: Diagnosis not present

## 2018-03-26 DIAGNOSIS — Z0000 Encounter for general adult medical examination without abnormal findings: Secondary | ICD-10-CM | POA: Diagnosis not present

## 2018-03-26 DIAGNOSIS — F329 Major depressive disorder, single episode, unspecified: Secondary | ICD-10-CM | POA: Diagnosis not present

## 2018-03-26 DIAGNOSIS — F605 Obsessive-compulsive personality disorder: Secondary | ICD-10-CM | POA: Diagnosis not present

## 2018-03-26 DIAGNOSIS — J45901 Unspecified asthma with (acute) exacerbation: Secondary | ICD-10-CM | POA: Diagnosis not present

## 2018-03-26 DIAGNOSIS — Z23 Encounter for immunization: Secondary | ICD-10-CM | POA: Diagnosis not present

## 2018-03-26 DIAGNOSIS — G4700 Insomnia, unspecified: Secondary | ICD-10-CM | POA: Diagnosis not present

## 2018-03-26 DIAGNOSIS — L0591 Pilonidal cyst without abscess: Secondary | ICD-10-CM | POA: Diagnosis not present

## 2018-11-11 DIAGNOSIS — J45901 Unspecified asthma with (acute) exacerbation: Secondary | ICD-10-CM | POA: Diagnosis not present

## 2019-02-28 DIAGNOSIS — M79642 Pain in left hand: Secondary | ICD-10-CM | POA: Diagnosis not present

## 2019-02-28 DIAGNOSIS — R202 Paresthesia of skin: Secondary | ICD-10-CM | POA: Diagnosis not present

## 2019-02-28 DIAGNOSIS — R200 Anesthesia of skin: Secondary | ICD-10-CM | POA: Diagnosis not present

## 2019-03-02 DIAGNOSIS — M50322 Other cervical disc degeneration at C5-C6 level: Secondary | ICD-10-CM | POA: Diagnosis not present

## 2019-03-02 DIAGNOSIS — M5030 Other cervical disc degeneration, unspecified cervical region: Secondary | ICD-10-CM | POA: Diagnosis not present

## 2019-03-02 DIAGNOSIS — M4312 Spondylolisthesis, cervical region: Secondary | ICD-10-CM | POA: Diagnosis not present

## 2020-05-14 ENCOUNTER — Emergency Department (HOSPITAL_BASED_OUTPATIENT_CLINIC_OR_DEPARTMENT_OTHER)
Admission: EM | Admit: 2020-05-14 | Discharge: 2020-05-14 | Disposition: A | Discharge status: Home / Self Care | Payer: BC Managed Care – PPO | Attending: Emergency Medicine | Admitting: Emergency Medicine

## 2020-05-14 ENCOUNTER — Encounter (HOSPITAL_BASED_OUTPATIENT_CLINIC_OR_DEPARTMENT_OTHER): Payer: Self-pay | Admitting: Emergency Medicine

## 2020-05-14 ENCOUNTER — Other Ambulatory Visit: Payer: Self-pay

## 2020-05-14 DIAGNOSIS — Z87891 Personal history of nicotine dependence: Secondary | ICD-10-CM | POA: Insufficient documentation

## 2020-05-14 DIAGNOSIS — L02213 Cutaneous abscess of chest wall: Secondary | ICD-10-CM | POA: Diagnosis not present

## 2020-05-14 DIAGNOSIS — L0291 Cutaneous abscess, unspecified: Secondary | ICD-10-CM

## 2020-05-14 MED ORDER — LIDOCAINE-EPINEPHRINE (PF) 2 %-1:200000 IJ SOLN
10.0000 mL | Freq: Once | INTRAMUSCULAR | Status: AC
  Administered 2020-05-14: 10 mL
  Filled 2020-05-14: qty 20

## 2020-05-14 MED ORDER — HYDROCODONE-ACETAMINOPHEN 5-325 MG PO TABS
1.0000 | ORAL_TABLET | Freq: Once | ORAL | Status: AC
  Administered 2020-05-14: 1 via ORAL
  Filled 2020-05-14: qty 1

## 2020-05-14 MED ORDER — IBUPROFEN 800 MG PO TABS: 800.0000 mg | ORAL_TABLET | Freq: Three times a day (TID) | ORAL | 0 refills | Status: DC

## 2020-05-14 NOTE — ED Triage Notes (Signed)
Pt has abscess to left chest wall.  Noted swelling and redness.

## 2020-05-14 NOTE — ED Provider Notes (Signed)
[facility] EMERGENCY DEPARTMENT Provider Note   CSN: [id number] Arrival date & time: 05/14/20  1749     History Chief Complaint  Patient presents with  . Abscess    Carlos E Kestenbaum Sr. is a 50 y.o. male who presents to the ED today with complaint of possible abscess to his left chest wall that he began noticing a couple of days ago.  Patient reports that he has multiple areas of blackheads on his body which is how this area started out.  He is always had a knot underneath the skin with a small blackhead however did not think much of it.  A couple days ago it began swelling with redness and pain prompting patient to come to the ED today.  He denies any fevers, chills, drainage.  He has no other complaints at this time.  The history is provided by the patient and medical records.       Past Medical History:  Diagnosis Date  . Anxiety and depression   . Chronic pain syndrome   . Depression   . Migraine   . Pain in joint, ankle and foot 10/19/2012  . Pneumonia   . Reflux   . Scoliosis     Patient Active Problem List   Diagnosis Date Noted  . Left shoulder pain 08/20/2017  . Right ankle injury, subsequent encounter 07/27/2017  . Moderate asthma with acute exacerbation 01/10/2016  . Esophageal reflux 03/30/2015  . Obstructive sleep apnea 03/30/2015    Past Surgical History:  Procedure Laterality Date  . ANKLE SURGERY    . ARTHROSCOPIC REPAIR ACL    . FOOT SURGERY         No family history on file.  Social History   Tobacco Use  . Smoking status: Former Games developer  . Smokeless tobacco: Never Used  Substance Use Topics  . Alcohol use: No  . Drug use: No    Home Medications Prior to Admission medications   Medication Sig Start Date End Date Taking? Authorizing Provider  ibuprofen (ADVIL) 800 MG tablet Take 1 tablet (800 mg total) by mouth 3 (three) times daily. 05/14/20  Yes Madalee Altmann, PA-C  albuterol (PROAIR HFA) 108 (90 Base) MCG/ACT inhaler  INHALE TWO PUFFS BY MOUTH EVERY 6 HOURS AS NEEDED FOR WHEEZING 06/25/17   [provider]  HYDROcodone-acetaminophen (NORCO) 5-325 MG tablet Take 1 tablet by mouth every 6 (six) hours as needed for moderate pain. 07/24/17   Hudnall, Azucena Fallen, MD  ibuprofen (ADVIL,MOTRIN) 800 MG tablet Take 1 tablet (800 mg total) by mouth every 8 (eight) hours as needed. 09/14/17   Sheard, Myeong O, DPM  lamoTRIgine (LAMICTAL) 100 MG tablet Take by mouth. 11/22/15   [provider]  naproxen (NAPROSYN) 375 MG tablet Take 1 tablet (375 mg total) by mouth 2 (two) times daily. 07/18/17   Rise Mu, PA-C  oxyCODONE-acetaminophen (PERCOCET) 7.5-325 MG tablet Take 1 tablet by mouth every 6 (six) hours as needed. 09/14/17   Sheard, Myeong O, DPM  promethazine-dextromethorphan (PROMETHAZINE-DM) 6.25-15 MG/5ML syrup Take 5 mLs by mouth at bedtime and may repeat dose one time if needed. 01/06/18   Jeannie Fend, PA-C    Allergies    Patient has no known allergies.  Review of Systems   Review of Systems  Constitutional: Negative for chills and fever.  Skin:       + abscess  All other systems reviewed and are negative.   Physical Exam Updated Vital Signs BP Marland Kitchen)  129/92   Pulse 88   Temp 98.4 F (36.9 C)   Resp 16   Ht 5\' 7"  (1.702 m)   Wt 94.3 kg   SpO2 97%   BMI 32.58 kg/m   Physical Exam Vitals and nursing note reviewed.  Constitutional:      Appearance: He is not ill-appearing.  HENT:     Head: Normocephalic and atraumatic.  Eyes:     Conjunctiva/sclera: Conjunctivae normal.  Cardiovascular:     Rate and Rhythm: Normal rate and regular rhythm.     Comments: 5  Pulmonary:     Effort: Pulmonary effort is normal.     Breath sounds: Normal breath sounds. No wheezing, rhonchi or rales.  Chest:       Comments: 5 x 3 cm area of induration and erythema to the left chest wall  Skin:    General: Skin is warm and dry.     Coloration: Skin is not jaundiced.  Neurological:      Mental Status: He is alert.     ED Results / Procedures / Treatments   Labs (all labs ordered are listed, but only abnormal results are displayed) Labs Reviewed - No data to display  EKG None  Radiology No results found.  Procedures . Incision and Drainage  Date/Time: 05/14/2020 10:03 PM Performed by: 05/16/2020, PA-C Authorized by: Tanda Rockers, PA-C   Consent:    Consent obtained:  Verbal   Consent given by:  Patient   Risks discussed:  Bleeding, incomplete drainage, pain and damage to other organs   Alternatives discussed:  No treatment Universal protocol:    Procedure explained and questions answered to patient or proxy's satisfaction: yes     Relevant documents present and verified: yes     Test results available : yes     Imaging studies available: yes     Required blood products, implants, devices, and special equipment available: yes     Site/side marked: yes     Immediately prior to procedure, a time out was called: yes     Patient identity confirmed:  Verbally with patient Location:    Type:  Abscess   Size:  5 x 3 cm   Location:  Trunk   Trunk location:  Chest Pre-procedure details:    Skin preparation:  Betadine Anesthesia:    Anesthesia method:  Local infiltration   Local anesthetic:  Lidocaine 2% WITH epi Procedure type:    Complexity:  Complex Procedure details:    Incision types:  Single straight   Incision depth:  Subcutaneous   Wound management:  Probed and deloculated, irrigated with saline and extensive cleaning   Drainage:  Purulent   Drainage amount:  Moderate   Packing materials:  1/2 in gauze Post-procedure details:    Procedure completion:  Tolerated well, no immediate complications     Medications Ordered in ED Medications  lidocaine-EPINEPHrine (XYLOCAINE W/EPI) 2 %-1:200000 (PF) injection 10 mL (has no administration in time range)  HYDROcodone-acetaminophen (NORCO/VICODIN) 5-325 MG per tablet 1 tablet (1 tablet Oral  Given 05/14/20 2048)    ED Course  I have reviewed the triage vital signs and the nursing notes.  Pertinent labs & imaging results that were available during my care of the patient were reviewed by me and considered in my medical decision making (see chart for details).    MDM Rules/Calculators/A&P  50 year old male who presents to the ED today with possible abscess to his left chest wall that presented about 4 days ago with erythema, swelling, tenderness.  On arrival to the ED vitals are stable.  Patient does appear uncomfortable today however nontoxic-appearing.  He has a 5 x 3 cm abscess with induration to the left chest wall that will require incision and drainage.  Pain medication provided.  Tetanus up-to-date.   I&D performed without complications.  Packing placed.  Patient instructed to follow-up in 48 hours for recheck.  Does not require antibiotics at this time given moderate amount of purulent material.  He is in agreement with plan and stable for discharge.  This note was prepared using Dragon voice recognition software and may include unintentional dictation errors due to the inherent limitations of voice recognition software.  Final Clinical Impression(s) / ED Diagnoses Final diagnoses:  Abscess    Rx / DC Orders ED Discharge Orders         Ordered    ibuprofen (ADVIL) 800 MG tablet  3 times daily        05/14/20 2157           Discharge Instructions     Please return to the ED in 48 hours for packing removal and recheck of your abscess While at home please apply warm compresses to the area and massage lightly Take Ibuprofen as needed for pain        Tanda Rockers, PA-C 05/14/20 2205    Pollyann Savoy, MD 05/14/20 2256

## 2020-05-14 NOTE — Discharge Instructions (Signed)
Please return to the ED in 48 hours for packing removal and recheck of your abscess While at home please apply warm compresses to the area and massage lightly Take Ibuprofen as needed for pain

## 2020-05-16 ENCOUNTER — Other Ambulatory Visit (HOSPITAL_BASED_OUTPATIENT_CLINIC_OR_DEPARTMENT_OTHER): Payer: Self-pay

## 2020-05-16 ENCOUNTER — Emergency Department (HOSPITAL_BASED_OUTPATIENT_CLINIC_OR_DEPARTMENT_OTHER)
Admission: EM | Admit: 2020-05-16 | Discharge: 2020-05-16 | Disposition: A | Discharge status: Home / Self Care | Payer: BC Managed Care – PPO | Attending: Emergency Medicine | Admitting: Emergency Medicine

## 2020-05-16 ENCOUNTER — Encounter (HOSPITAL_BASED_OUTPATIENT_CLINIC_OR_DEPARTMENT_OTHER): Payer: Self-pay

## 2020-05-16 ENCOUNTER — Other Ambulatory Visit: Payer: Self-pay

## 2020-05-16 DIAGNOSIS — Z87891 Personal history of nicotine dependence: Secondary | ICD-10-CM | POA: Insufficient documentation

## 2020-05-16 DIAGNOSIS — L0390 Cellulitis, unspecified: Secondary | ICD-10-CM

## 2020-05-16 DIAGNOSIS — L03313 Cellulitis of chest wall: Secondary | ICD-10-CM | POA: Insufficient documentation

## 2020-05-16 DIAGNOSIS — Z4801 Encounter for change or removal of surgical wound dressing: Secondary | ICD-10-CM | POA: Insufficient documentation

## 2020-05-16 MED ORDER — HYDROCODONE-ACETAMINOPHEN 5-325 MG PO TABS
1.0000 | ORAL_TABLET | Freq: Once | ORAL | Status: AC
  Administered 2020-05-16: 1 via ORAL
  Filled 2020-05-16: qty 1

## 2020-05-16 MED ORDER — CLINDAMYCIN HCL 150 MG PO CAPS
300.0000 mg | ORAL_CAPSULE | Freq: Once | ORAL | Status: AC
Start: 2020-05-16 — End: 2020-05-16
  Administered 2020-05-16: 300 mg via ORAL
  Filled 2020-05-16: qty 2

## 2020-05-16 MED ORDER — TRAMADOL HCL 50 MG PO TABS
50.0000 mg | ORAL_TABLET | Freq: Four times a day (QID) | ORAL | 0 refills | Status: DC | PRN
  Filled 2020-05-16: qty 15, 4d supply, fill #0

## 2020-05-16 MED ORDER — CLINDAMYCIN HCL 300 MG PO CAPS
300.0000 mg | ORAL_CAPSULE | Freq: Three times a day (TID) | ORAL | 0 refills | Status: AC
  Filled 2020-05-16: qty 30, 10d supply, fill #0

## 2020-05-16 NOTE — ED Notes (Signed)
Dr. Lockie Mola in room, removing packing from abscess, explaining to patient about need for antibiotics and home care following packing removal, patient verbalized understanding.

## 2020-05-16 NOTE — ED Notes (Signed)
Patient verbalized understanding of dc instructions, prescriptions, follow up referrals and reasons to return to ER for reevaluation.  

## 2020-05-16 NOTE — ED Provider Notes (Signed)
[facility] EMERGENCY DEPT Provider Note   CSN: [id number] Arrival date & time: 05/16/20  1520     History Chief Complaint  Patient presents with  . Wound Check    Carlos E Harshberger Sr. is a 50 y.o. male.  Patient here for wound recheck after having abscess drained to the left chest wall.  Not on antibiotics.  Having some pain at the site and has had some extra drainage with packing in place.  Denies any fevers or chills.  The history is provided by the patient.  Wound Check This is a new problem. The current episode started more than 2 days ago. The problem occurs daily. The problem has been gradually improving. Associated symptoms comments: Pain over the left chest wall where wound is. Nothing aggravates the symptoms. Nothing relieves the symptoms. He has tried nothing for the symptoms. The treatment provided no relief.       Past Medical History:  Diagnosis Date  . Anxiety and depression   . Chronic pain syndrome   . Depression   . Migraine   . Pain in joint, ankle and foot 10/19/2012  . Pneumonia   . Reflux   . Scoliosis     Patient Active Problem List   Diagnosis Date Noted  . Left shoulder pain 08/20/2017  . Right ankle injury, subsequent encounter 07/27/2017  . Moderate asthma with acute exacerbation 01/10/2016  . Esophageal reflux 03/30/2015  . Obstructive sleep apnea 03/30/2015    Past Surgical History:  Procedure Laterality Date  . ANKLE SURGERY    . ARTHROSCOPIC REPAIR ACL    . FOOT SURGERY         History reviewed. No pertinent family history.  Social History   Tobacco Use  . Smoking status: Former Games developer  . Smokeless tobacco: Never Used  Substance Use Topics  . Alcohol use: No  . Drug use: No    Home Medications Prior to Admission medications   Medication Sig Start Date End Date Taking? Authorizing Provider  clindamycin (CLEOCIN) 300 MG capsule Take 1 capsule (300 mg total) by mouth 3 (three) times daily for 10 days. 05/16/20  05/26/20 Yes Curatolo, Adam, DO  traMADol (ULTRAM) 50 MG tablet Take 1 tablet (50 mg total) by mouth every 6 (six) hours as needed. 05/16/20  Yes Curatolo, Adam, DO  albuterol (PROAIR HFA) 108 (90 Base) MCG/ACT inhaler INHALE TWO PUFFS BY MOUTH EVERY 6 HOURS AS NEEDED FOR WHEEZING 06/25/17   [provider]  HYDROcodone-acetaminophen (NORCO) 5-325 MG tablet Take 1 tablet by mouth every 6 (six) hours as needed for moderate pain. 07/24/17   Hudnall, Azucena Fallen, MD  ibuprofen (ADVIL) 800 MG tablet Take 1 tablet (800 mg total) by mouth 3 (three) times daily. 05/14/20   Hyman Hopes, Margaux, PA-C  ibuprofen (ADVIL,MOTRIN) 800 MG tablet Take 1 tablet (800 mg total) by mouth every 8 (eight) hours as needed. 09/14/17   Sheard, Myeong O, DPM  lamoTRIgine (LAMICTAL) 100 MG tablet Take by mouth. 11/22/15   [provider]  naproxen (NAPROSYN) 375 MG tablet Take 1 tablet (375 mg total) by mouth 2 (two) times daily. 07/18/17   Rise Mu, PA-C  oxyCODONE-acetaminophen (PERCOCET) 7.5-325 MG tablet Take 1 tablet by mouth every 6 (six) hours as needed. 09/14/17   Sheard, Myeong O, DPM  promethazine-dextromethorphan (PROMETHAZINE-DM) 6.25-15 MG/5ML syrup Take 5 mLs by mouth at bedtime and may repeat dose one time if needed. 01/06/18   Jeannie Fend, PA-C    Allergies  Patient has no known allergies.  Review of Systems   Review of Systems  Skin: Positive for color change and wound. Negative for pallor and rash.    Physical Exam Updated Vital Signs BP (!) 132/92 (BP Location: Right Arm)   Pulse 73   Temp 98.7 F (37.1 C) (Oral)   Resp 20   SpO2 98%   Physical Exam Constitutional:      General: He is not in acute distress.    Appearance: He is not ill-appearing.  Cardiovascular:     Pulses: Normal pulses.  Skin:    Capillary Refill: Capillary refill takes less than 2 seconds.     Comments: Packing in place over left chest where abscess cellulitis area has been drained previously, there  is some small redness around this area but no other purulent or serosanguineous drainage at this time  Neurological:     Mental Status: He is alert.     ED Results / Procedures / Treatments   Labs (all labs ordered are listed, but only abnormal results are displayed) Labs Reviewed - No data to display  EKG None  Radiology No results found.  Procedures Procedures   Medications Ordered in ED Medications  clindamycin (CLEOCIN) capsule 300 mg (has no administration in time range)  HYDROcodone-acetaminophen (NORCO/VICODIN) 5-325 MG per tablet 1 tablet (1 tablet Oral Given 05/16/20 1542)    ED Course  I have reviewed the triage vital signs and the nursing notes.  Pertinent labs & imaging results that were available during my care of the patient were reviewed by me and considered in my medical decision making (see chart for details).    MDM Rules/Calculators/A&P                          Carlos E Moro Sr. is here for wound recheck.  Had I&D of left chest wall abscess 2 days ago.  Packing has been in place during that time.  Packing was removed.  No large amount of purulent drainage or serosanguineous drainage at this time.  There is some small surrounding cellulitis likely.  We will start him on antibiotics.  We will give him tramadol for breakthrough pain to go along with Tylenol Motrin.  Further wound care instructions were given.  Understands return precautions and wound recheck instructions.  Discharged in good condition.  At this time no concern for deep infectious process as overall wound now appears to be mostly superficial.  This chart was dictated using voice recognition software.  Despite best efforts to proofread,  errors can occur which can change the documentation meaning.   Final Clinical Impression(s) / ED Diagnoses Final diagnoses:  Cellulitis, unspecified cellulitis site    Rx / DC Orders ED Discharge Orders         Ordered    traMADol (ULTRAM) 50 MG tablet   Every 6 hours PRN        05/16/20 1545    clindamycin (CLEOCIN) 300 MG capsule  3 times daily        05/16/20 1545           Virgina Norfolk, DO 05/16/20 1549

## 2020-05-16 NOTE — Discharge Instructions (Signed)
Recommend Tylenol Motrin for pain.  Use tramadol for any breakthrough pain.  Take antibiotic as prescribed.  Please use soap and warm water twice daily at wound site to express any further drainage.  You may use some bacitracin or Neosporin over the wound site and then cover with a bandage as you have been doing.  Please have wound rechecked with your primary care doctor.  If you develop any fever, worsening redness or pain please return to the emergency department.

## 2020-05-16 NOTE — ED Triage Notes (Signed)
Patient here for wound recheck after abscess drained on Monday.

## 2020-05-17 ENCOUNTER — Other Ambulatory Visit (HOSPITAL_BASED_OUTPATIENT_CLINIC_OR_DEPARTMENT_OTHER): Payer: Self-pay

## 2020-09-07 ENCOUNTER — Encounter (HOSPITAL_BASED_OUTPATIENT_CLINIC_OR_DEPARTMENT_OTHER): Payer: Self-pay | Admitting: Emergency Medicine

## 2020-09-07 ENCOUNTER — Other Ambulatory Visit: Payer: Self-pay

## 2020-09-07 ENCOUNTER — Emergency Department (HOSPITAL_BASED_OUTPATIENT_CLINIC_OR_DEPARTMENT_OTHER)
Admission: EM | Admit: 2020-09-07 | Discharge: 2020-09-08 | Discharge status: Against Medical Advice | Payer: BC Managed Care – PPO | Attending: Emergency Medicine | Admitting: Emergency Medicine

## 2020-09-07 DIAGNOSIS — N50812 Left testicular pain: Secondary | ICD-10-CM | POA: Diagnosis not present

## 2020-09-07 DIAGNOSIS — J45909 Unspecified asthma, uncomplicated: Secondary | ICD-10-CM | POA: Diagnosis not present

## 2020-09-07 DIAGNOSIS — Z87891 Personal history of nicotine dependence: Secondary | ICD-10-CM | POA: Diagnosis not present

## 2020-09-07 DIAGNOSIS — R1032 Left lower quadrant pain: Secondary | ICD-10-CM | POA: Diagnosis not present

## 2020-09-07 LAB — URINALYSIS, ROUTINE W REFLEX MICROSCOPIC
Bilirubin Urine: NEGATIVE
Glucose, UA: NEGATIVE mg/dL
Ketones, ur: NEGATIVE mg/dL
Leukocytes,Ua: NEGATIVE
Nitrite: NEGATIVE
Specific Gravity, Urine: 1.034 — ABNORMAL HIGH (ref 1.005–1.030)
pH: 5.5 (ref 5.0–8.0)

## 2020-09-07 NOTE — ED Provider Notes (Signed)
[facility]   CSN: [id number] Arrival date & time: 09/07/20  2300     History Chief Complaint  Patient presents with   Testicle Pain    Carlos E Bouldin Sr. is a 50 y.o. male.  Patient presents with intermittent left testicular pain for the past week or 2.  Denies any fall or trauma.  Describes a "squeezing sensation and feeling like getting kicked in the testicle" that comes and goes lasting for several minutes to hours at a time.  Seems to come on randomly.  He had this problem several months ago, was seen at an outside hospital and diagnosed with a possible strain. He was given antibiotics by his report.  He did have an ultrasound but isn't sure what it showed.  States the pain went away for several months but then came back a few weeks ago and is coming and going worse and he moves around.  Denies any penile discharge.  Denies any pain with urination or blood in the urine.  Denies any fevers, chills, nausea or vomiting.  Denies any back pain out of his ordinary.  Denies any abdominal pain or diarrhea.  He is sexually monogamous with his wife.  She has no symptoms.  The history is provided by the patient and the spouse.  Testicle Pain Pertinent negatives include no chest pain, no abdominal pain, no headaches and no shortness of breath.      Past Medical History:  Diagnosis Date   Anxiety and depression    Chronic pain syndrome    Depression    Migraine    Pain in joint, ankle and foot 10/19/2012   Pneumonia    Reflux    Scoliosis     Patient Active Problem List   Diagnosis Date Noted   Left shoulder pain 08/20/2017   Right ankle injury, subsequent encounter 07/27/2017   Moderate asthma with acute exacerbation 01/10/2016   Esophageal reflux 03/30/2015   Obstructive sleep apnea 03/30/2015    Past Surgical History:  Procedure Laterality Date   ANKLE SURGERY     ARTHROSCOPIC REPAIR ACL     FOOT SURGERY         History  reviewed. No pertinent family history.  Social History   Tobacco Use   Smoking status: Former    Pack years: 0.00   Smokeless tobacco: Never  Substance Use Topics   Alcohol use: No   Drug use: No    Home Medications Prior to Admission medications   Medication Sig Start Date End Date Taking? Authorizing Provider  albuterol (PROAIR HFA) 108 (90 Base) MCG/ACT inhaler INHALE TWO PUFFS BY MOUTH EVERY 6 HOURS AS NEEDED FOR WHEEZING 06/25/17   [provider]  HYDROcodone-acetaminophen (NORCO) 5-325 MG tablet Take 1 tablet by mouth every 6 (six) hours as needed for moderate pain. 07/24/17   Hudnall, Azucena FallenShane R, MD  ibuprofen (ADVIL) 800 MG tablet Take 1 tablet (800 mg total) by mouth 3 (three) times daily. 05/14/20   Hyman HopesVenter, Margaux, PA-C  ibuprofen (ADVIL,MOTRIN) 800 MG tablet Take 1 tablet (800 mg total) by mouth every 8 (eight) hours as needed. 09/14/17   Sheard, Myeong O, DPM  lamoTRIgine (LAMICTAL) 100 MG tablet Take by mouth. 11/22/15   [provider]  naproxen (NAPROSYN) 375 MG tablet Take 1 tablet (375 mg total) by mouth 2 (two) times daily. 07/18/17   Rise MuLeaphart, Kenneth T, PA-C  oxyCODONE-acetaminophen (PERCOCET) 7.5-325 MG tablet Take 1 tablet by mouth every 6 (six)  hours as needed. 09/14/17   Sheard, Myeong O, DPM  promethazine-dextromethorphan (PROMETHAZINE-DM) 6.25-15 MG/5ML syrup Take 5 mLs by mouth at bedtime and may repeat dose one time if needed. 01/06/18   Jeannie Fend, PA-C  traMADol (ULTRAM) 50 MG tablet Take 1 tablet (50 mg total) by mouth every 6 (six) hours as needed. 05/16/20   Virgina Norfolk, DO    Allergies    Patient has no known allergies.  Review of Systems   Review of Systems  Constitutional:  Negative for activity change, appetite change and fever.  HENT:  Negative for congestion and rhinorrhea.   Eyes:  Negative for visual disturbance.  Respiratory:  Negative for cough, chest tightness and shortness of breath.   Cardiovascular:  Negative for chest  pain.  Gastrointestinal:  Negative for abdominal pain, nausea and vomiting.  Genitourinary:  Positive for testicular pain. Negative for dysuria and hematuria.  Musculoskeletal:  Negative for arthralgias and myalgias.  Skin:  Negative for rash.  Neurological:  Negative for dizziness, weakness and headaches.   all other systems are negative except as noted in the HPI and PMH.   Physical Exam Updated Vital Signs BP 126/89 (BP Location: Right Arm)   Pulse 67   Temp 98.1 F (36.7 C) (Oral)   Resp 18   Ht 5\' 7"  (1.702 m)   Wt 94.3 kg   SpO2 98%   BMI 32.58 kg/m   Physical Exam Vitals and nursing note reviewed.  Constitutional:      General: He is not in acute distress.    Appearance: He is well-developed.  HENT:     Head: Normocephalic and atraumatic.     Mouth/Throat:     Pharynx: No oropharyngeal exudate.  Eyes:     Conjunctiva/sclera: Conjunctivae normal.     Pupils: Pupils are equal, round, and reactive to light.  Neck:     Comments: No meningismus. Cardiovascular:     Rate and Rhythm: Normal rate and regular rhythm.     Heart sounds: Normal heart sounds. No murmur heard. Pulmonary:     Effort: Pulmonary effort is normal. No respiratory distress.     Breath sounds: Normal breath sounds.  Abdominal:     Palpations: Abdomen is soft.     Tenderness: There is abdominal tenderness. There is no guarding or rebound.     Comments: Mild left lower quadrant tenderness.  No guarding or rebound  Genitourinary:    Comments: Testicles appear normal to inspection.  Left testicle is nontender currently.  There is no pain to manipulation.  It appears symmetric to the other side.  No overlying erythema or induration. No penile discharge. Musculoskeletal:        General: No tenderness. Normal range of motion.     Cervical back: Normal range of motion and neck supple.  Skin:    General: Skin is warm.  Neurological:     Mental Status: He is alert and oriented to person, place, and  time.     Cranial Nerves: No cranial nerve deficit.     Motor: No abnormal muscle tone.     Coordination: Coordination normal.     Comments: No ataxia on finger to nose bilaterally. No pronator drift. 5/5 strength throughout. CN 2-12 intact.Equal grip strength. Sensation intact.   Psychiatric:        Behavior: Behavior normal.    ED Results / Procedures / Treatments   Labs (all labs ordered are listed, but only abnormal results are displayed) Labs Reviewed  URINALYSIS, ROUTINE W REFLEX MICROSCOPIC - Abnormal; Notable for the following components:      Result Value   Specific Gravity, Urine 1.034 (*)    Hgb urine dipstick TRACE (*)    Protein, ur TRACE (*)    All other components within normal limits  URINE CULTURE    EKG None  Radiology CT Renal Stone Study  Result Date: 09/08/2020 CLINICAL DATA:  50 year old male with hematuria. EXAM: CT ABDOMEN AND PELVIS WITHOUT CONTRAST TECHNIQUE: Multidetector CT imaging of the abdomen and pelvis was performed following the standard protocol without IV contrast. COMPARISON:  CT abdomen pelvis dated 01/09/2015. FINDINGS: Evaluation of this exam is limited in the absence of intravenous contrast. Lower chest: The visualized lung bases are clear. No intra-abdominal free air or free fluid. Hepatobiliary: No focal liver abnormality is seen. No gallstones, gallbladder wall thickening, or biliary dilatation. Pancreas: Unremarkable. No pancreatic ductal dilatation or surrounding inflammatory changes. Spleen: Normal in size without focal abnormality. Adrenals/Urinary Tract: Mild bilateral adrenal thickening/hyperplasia. The kidneys, visualized ureters, and urinary bladder appear unremarkable. Stomach/Bowel: There is moderate stool throughout the colon. Several small sigmoid diverticula without active inflammatory changes. There is no bowel obstruction or active inflammation. The appendix is normal. Vascular/Lymphatic: Mild aortoiliac atherosclerotic disease.  The IVC is unremarkable. No portal venous gas. There is no adenopathy. Reproductive: The prostate and seminal vesicles are grossly unremarkable. No pelvic mass. Other: Small fat containing umbilical hernia Musculoskeletal: Levoscoliosis. No acute osseous pathology. IMPRESSION: 1. No acute intra-abdominal or pelvic pathology. No hydronephrosis or nephrolithiasis. 2. Moderate colonic stool burden. No bowel obstruction. Normal appendix. 3. Sigmoid diverticulosis. 4. Aortic Atherosclerosis (ICD10-I70.0). Electronically Signed   By: Elgie Collard M.D.   On: 09/08/2020 01:17    Procedures Procedures   Medications Ordered in ED Medications - No data to display  ED Course  I have reviewed the triage vital signs and the nursing notes.  Pertinent labs & imaging results that were available during my care of the patient were reviewed by me and considered in my medical decision making (see chart for details).    MDM Rules/Calculators/A&P                         Intermittent left testicular pain for several weeks.  None currently.  Denies any infectious symptoms or concern for STDs  UA shows trace hemoglobin.  Testicle feels normal on exam currently.  Did have episode of pain in the ED.  This resolved quickly.  Ultrasound is not available at this location.  Discussed possibility of intermittent testicular torsion with the patient.  Discussed this is a time sensitive diagnosis that he should be transferred for emergent ultrasound tonight.  Patient states he does not want to wait and will come back tomorrow.  He does not want to be transferred to [facility].  Discussed with the patient that if he is having testicular torsion this can be testicle threatening and he should be diagnosed with soon as possible.  However he has been having pain intermittently for several weeks. He requests CT scan which was done. No kidney stones or other explanation for his hematuria and testicular pain. Does show some  constipation.  He declines emergent transfer for ultrasound tonight and states he will return tomorrow. Will attempt to schedule outpatient ultrasound tomorrow though emphasized again that it is recommended that ultrasound be done emergently. He appears to have capacity to make this decision Will also give urology follow-up. He understands  he will be leaving AGAINST MEDICAL ADVICE because we cannot rule out intermittent testicular torsion at this time which can be threatening to the viability of the testicle. Final Clinical Impression(s) / ED Diagnoses Final diagnoses:  Left testicular pain    Rx / DC Orders ED Discharge Orders     None        Sergi Gellner, Jeannett Senior, MD 09/08/20 0225

## 2020-09-07 NOTE — ED Triage Notes (Addendum)
  Patient comes in with L testicle pain that has been going on for about a week.  Patient states it hurts when he moves around a lot.  Denies any swelling or injury to the testicle.  Had an STD that gave the same pain happen before and given steroids and antibiotics.  No penile discharge.  Denies possible STD at this time. Pain 5/10, L testicle.

## 2020-09-08 ENCOUNTER — Emergency Department (HOSPITAL_BASED_OUTPATIENT_CLINIC_OR_DEPARTMENT_OTHER)
Admission: RE | Admit: 2020-09-08 | Discharge: 2020-09-08 | Disposition: A | Discharge status: Home / Self Care | Payer: BC Managed Care – PPO | Source: Ambulatory Visit | Attending: Emergency Medicine | Admitting: Emergency Medicine

## 2020-09-08 ENCOUNTER — Other Ambulatory Visit (HOSPITAL_BASED_OUTPATIENT_CLINIC_OR_DEPARTMENT_OTHER): Payer: Self-pay | Admitting: Emergency Medicine

## 2020-09-08 ENCOUNTER — Emergency Department (HOSPITAL_BASED_OUTPATIENT_CLINIC_OR_DEPARTMENT_OTHER)
Admit: 2020-09-08 | Discharge: 2020-09-08 | Disposition: A | Discharge status: Home / Self Care | Payer: BC Managed Care – PPO | Attending: Emergency Medicine | Admitting: Emergency Medicine

## 2020-09-08 ENCOUNTER — Emergency Department (HOSPITAL_BASED_OUTPATIENT_CLINIC_OR_DEPARTMENT_OTHER): Payer: BC Managed Care – PPO

## 2020-09-08 DIAGNOSIS — N50812 Left testicular pain: Secondary | ICD-10-CM

## 2020-09-08 MED ORDER — NAPROXEN 500 MG PO TABS: 500.0000 mg | ORAL_TABLET | Freq: Two times a day (BID) | ORAL | 0 refills | Status: AC | PRN

## 2020-09-08 MED ORDER — NAPROXEN 500 MG PO TABS
500.0000 mg | ORAL_TABLET | Freq: Two times a day (BID) | ORAL | 0 refills | Status: DC
  Filled 2020-09-08: qty 30, 15d supply, fill #0

## 2020-09-08 MED ORDER — HYDROCODONE-ACETAMINOPHEN 5-325 MG PO TABS
1.0000 | ORAL_TABLET | Freq: Once | ORAL | Status: AC
  Administered 2020-09-08: 1 via ORAL
  Filled 2020-09-08: qty 1

## 2020-09-08 NOTE — Discharge Instructions (Signed)
It was recommended you go to [facility] tonight for an ultrasound of your testicle. You chose to go home and come back for an ultrasound later.   Patient follow-up with the urologist for your ongoing pain in your testicle. Return to the [facility] ED sooner if your pain returns.

## 2020-09-09 LAB — URINE CULTURE: Culture: <10000 — AB

## 2020-09-10 ENCOUNTER — Other Ambulatory Visit (HOSPITAL_BASED_OUTPATIENT_CLINIC_OR_DEPARTMENT_OTHER): Payer: Self-pay

## 2021-03-01 ENCOUNTER — Other Ambulatory Visit: Payer: Self-pay

## 2021-03-01 ENCOUNTER — Encounter (HOSPITAL_BASED_OUTPATIENT_CLINIC_OR_DEPARTMENT_OTHER): Payer: Self-pay | Admitting: Emergency Medicine

## 2021-03-01 DIAGNOSIS — R059 Cough, unspecified: Secondary | ICD-10-CM | POA: Insufficient documentation

## 2021-03-01 DIAGNOSIS — Z20822 Contact with and (suspected) exposure to covid-19: Secondary | ICD-10-CM | POA: Diagnosis not present

## 2021-03-01 DIAGNOSIS — Z5321 Procedure and treatment not carried out due to patient leaving prior to being seen by health care provider: Secondary | ICD-10-CM | POA: Insufficient documentation

## 2021-03-01 LAB — CBC
HCT: 39.3 % (ref 39.0–52.0)
Hemoglobin: 13.1 g/dL (ref 13.0–17.0)
MCH: 30.5 pg (ref 26.0–34.0)
MCHC: 33.3 g/dL (ref 30.0–36.0)
MCV: 91.6 fL (ref 80.0–100.0)
Platelets: 253 10*3/uL (ref 150–400)
RBC: 4.29 MIL/uL (ref 4.22–5.81)
RDW: 13.7 % (ref 11.5–15.5)
WBC: 5.8 10*3/uL (ref 4.0–10.5)
nRBC: 0 % (ref 0.0–0.2)

## 2021-03-01 LAB — TROPONIN I (HIGH SENSITIVITY): Troponin I (High Sensitivity): 3 ng/L (ref <18)

## 2021-03-01 NOTE — ED Triage Notes (Signed)
Pt presents for cough, rhinorrhea, and SOB x 1 week. Negative covid tests at home, concerned for PNU, states this feel like previously. H/o asthma, has used rescue inhaler x2 this week.

## 2021-03-02 ENCOUNTER — Emergency Department (HOSPITAL_BASED_OUTPATIENT_CLINIC_OR_DEPARTMENT_OTHER): Payer: BC Managed Care – PPO | Admitting: Radiology

## 2021-03-02 ENCOUNTER — Emergency Department (HOSPITAL_BASED_OUTPATIENT_CLINIC_OR_DEPARTMENT_OTHER)
Admission: EM | Admit: 2021-03-02 | Discharge: 2021-03-02 | Disposition: A | Discharge status: Home / Self Care | Payer: BC Managed Care – PPO | Attending: Emergency Medicine | Admitting: Emergency Medicine

## 2021-03-02 LAB — BASIC METABOLIC PANEL
Anion gap: 8 (ref 5–15)
BUN: 13 mg/dL (ref 6–20)
CO2: 25 mmol/L (ref 22–32)
Calcium: 9.2 mg/dL (ref 8.9–10.3)
Chloride: 106 mmol/L (ref 98–111)
Creatinine, Ser: 0.81 mg/dL (ref 0.61–1.24)
GFR, Estimated: >60 mL/min (ref >60)
Glucose, Bld: 125 mg/dL — ABNORMAL HIGH (ref 70–99)
Potassium: 3.8 mmol/L (ref 3.5–5.1)
Sodium: 139 mmol/L (ref 135–145)

## 2021-03-02 LAB — RESP PANEL BY RT-PCR (FLU A&B, COVID) ARPGX2
Influenza A by PCR: NEGATIVE
Influenza B by PCR: NEGATIVE
SARS Coronavirus 2 by RT PCR: NEGATIVE

## 2021-09-12 ENCOUNTER — Other Ambulatory Visit: Payer: Self-pay

## 2021-09-12 ENCOUNTER — Emergency Department (HOSPITAL_BASED_OUTPATIENT_CLINIC_OR_DEPARTMENT_OTHER)
Admission: EM | Admit: 2021-09-12 | Discharge: 2021-09-12 | Disposition: A | Discharge status: Home / Self Care | Payer: BC Managed Care – PPO | Attending: Emergency Medicine | Admitting: Emergency Medicine

## 2021-09-12 DIAGNOSIS — M722 Plantar fascial fibromatosis: Secondary | ICD-10-CM | POA: Insufficient documentation

## 2021-09-12 DIAGNOSIS — M79672 Pain in left foot: Secondary | ICD-10-CM

## 2021-09-12 MED ORDER — IBUPROFEN 800 MG PO TABS: 800.0000 mg | ORAL_TABLET | Freq: Three times a day (TID) | ORAL | 0 refills | Status: DC

## 2021-09-12 MED ORDER — IBUPROFEN 800 MG PO TABS
800.0000 mg | ORAL_TABLET | Freq: Once | ORAL | Status: AC
  Administered 2021-09-12: 800 mg via ORAL
  Filled 2021-09-12: qty 1

## 2021-09-12 MED ORDER — METHOCARBAMOL 500 MG PO TABS: 500.0000 mg | ORAL_TABLET | Freq: Two times a day (BID) | ORAL | 0 refills | Status: DC

## 2021-09-12 NOTE — ED Triage Notes (Signed)
Patient presents to ED via POV. Patient reports left foot pain than this past weekend after playing with his son at the beach. Denies injury or trauma.

## 2021-09-12 NOTE — Discharge Instructions (Signed)
Please use Tylenol or ibuprofen for pain.  You may use 600-800 mg ibuprofen every 6 hours or 1000 mg of Tylenol every 6 hours.  You may choose to alternate between the 2.  This would be most effective.  Not to exceed 4 g of Tylenol within 24 hours.  Not to exceed 3200 mg ibuprofen 24 hours.  You can use the muscle relaxant in addition to the above to help with pain. It may make you drowsy so be cautious when taking it and driving or operating heavy machinery.  Please use the rehab exercises I provided above.  Follow-up with orthopedic doctors contact information I provided for additional management and planning.

## 2021-09-12 NOTE — ED Provider Notes (Signed)
[facility] Provider Note   CSN: [id number] Arrival date & time: 09/12/21  1359     History  Chief Complaint  Patient presents with   Foot Pain    Daryus E Husain Sr. is a 51 y.o. male with past medical history significant for previous ankle and foot surgery, previous arthroscopic repair of ACL who presents with concern for left foot pain, concern for plantar fasciitis after playing with son at the beach this weekend.  Patient denies any injury, trauma, fall.  He denies any new numbness, tingling.  Patient reports that the pain is most significant on the bottom of his foot, worse when he takes a step first thing in the morning.  Patient also endorses some tightness and pain at location of his previous arthrodesis on the left.   Foot Pain       Home Medications Prior to Admission medications   Medication Sig Start Date End Date Taking? Authorizing Provider  ibuprofen (ADVIL) 800 MG tablet Take 1 tablet (800 mg total) by mouth 3 (three) times daily. 09/12/21  Yes Hager Compston H, PA-C  methocarbamol (ROBAXIN) 500 MG tablet Take 1 tablet (500 mg total) by mouth 2 (two) times daily. 09/12/21  Yes Aerika Groll H, PA-C  albuterol (PROAIR HFA) 108 (90 Base) MCG/ACT inhaler INHALE TWO PUFFS BY MOUTH EVERY 6 HOURS AS NEEDED FOR WHEEZING 06/25/17   [provider]  HYDROcodone-acetaminophen (NORCO) 5-325 MG tablet Take 1 tablet by mouth every 6 (six) hours as needed for moderate pain. 07/24/17   Lenda Kelp, MD  lamoTRIgine (LAMICTAL) 100 MG tablet Take by mouth. 11/22/15   [provider]  naproxen (NAPROSYN) 500 MG tablet Take 1 tablet (500 mg total) by mouth 2 (two) times daily as needed. 09/08/20   Rancour, Jeannett Senior, MD  oxyCODONE-acetaminophen (PERCOCET) 7.5-325 MG tablet Take 1 tablet by mouth every 6 (six) hours as needed. 09/14/17   Sheard, Myeong O, DPM  promethazine-dextromethorphan (PROMETHAZINE-DM) 6.25-15 MG/5ML syrup Take  5 mLs by mouth at bedtime and may repeat dose one time if needed. 01/06/18   Jeannie Fend, PA-C  traMADol (ULTRAM) 50 MG tablet Take 1 tablet (50 mg total) by mouth every 6 (six) hours as needed. 05/16/20   Virgina Norfolk, DO      Allergies    Patient has no known allergies.    Review of Systems   Review of Systems  Musculoskeletal:  Positive for arthralgias.  All other systems reviewed and are negative.   Physical Exam Updated Vital Signs BP 119/86 (BP Location: Left Arm)   Pulse 83   Temp 98.2 F (36.8 C) (Oral)   Resp 18   SpO2 97%  Physical Exam Vitals and nursing note reviewed.  Constitutional:      General: He is not in acute distress.    Appearance: Normal appearance.  HENT:     Head: Normocephalic and atraumatic.  Eyes:     General:        Right eye: No discharge.        Left eye: No discharge.  Cardiovascular:     Rate and Rhythm: Normal rate and regular rhythm.     Pulses: Normal pulses.  Pulmonary:     Effort: Pulmonary effort is normal. No respiratory distress.  Musculoskeletal:        General: No deformity.     Comments: Patient with intact strength, intact range of motion to plantarflexion, dorsiflexion, movement of all toes of the left  foot.  Patient with signs of postsurgical repair without excessive redness, tenderness, swelling.  He has some tenderness on the posterior aspect of the foot on the medial side.  Most focal tenderness is at the plantar aspect of the left foot, consistent with the plantar fascia.  Worse with palpation.  No significant tenderness of Achilles tendon, or tenderness of gastroc.  Patient is able to ambulate without assistance  Skin:    General: Skin is warm and dry.     Capillary Refill: Capillary refill takes less than 2 seconds.  Neurological:     Mental Status: He is alert and oriented to person, place, and time.  Psychiatric:        Mood and Affect: Mood normal.        Behavior: Behavior normal.     ED Results /  Procedures / Treatments   Labs (all labs ordered are listed, but only abnormal results are displayed) Labs Reviewed - No data to display  EKG None  Radiology No results found.  Procedures Procedures    Medications Ordered in ED Medications  ibuprofen (ADVIL) tablet 800 mg (800 mg Oral Given 09/12/21 1531)    ED Course/ Medical Decision Making/ A&P                           Medical Decision Making Risk Prescription drug management.   This is an overall well-appearing 51 year old male with past medical history significant for previous left foot, ankle surgery who presents with concern for new exacerbation of foot and plantar fascia pain.  Patient denies any fall, trauma, or other injury.  On my exam he is neurovascularly intact with some tenderness of plantar fascia as well as some tenderness along the site of his previous surgery.  Patient without any joint redness, lack of range of motion, low clinical concern for septic arthritis, gout, or other infectious process.  Patient's symptoms are most consistent with plantar fasciitis, as well as arthritis.  Discussed I would recommend ibuprofen, Tylenol, rehab exercises, and follow-up with orthopedics.  Think would be reasonable to add muscle relaxant to help with Pain control.  Without new traumatic injury, pulse discrepancy, focal deformity, or new traumatic injury do not think that radiographic imaging is necessary in this patient.  Encourage follow-up with orthopedics.  Discussed extensive return precautions.  Patient discharged in stable condition at this time. Final Clinical Impression(s) / ED Diagnoses Final diagnoses:  Plantar fasciitis of left foot  Left foot pain    Rx / DC Orders ED Discharge Orders          Ordered    ibuprofen (ADVIL) 800 MG tablet  3 times daily        09/12/21 1522    methocarbamol (ROBAXIN) 500 MG tablet  2 times daily        09/12/21 1522              Aubra Pappalardo, [place] H,  [place] 09/12/21 1532    Tegeler, Canary Brim, MD 09/12/21 1544

## 2021-11-29 ENCOUNTER — Emergency Department (HOSPITAL_BASED_OUTPATIENT_CLINIC_OR_DEPARTMENT_OTHER)
Admission: EM | Admit: 2021-11-29 | Discharge: 2021-11-29 | Disposition: A | Discharge status: Home / Self Care | Payer: BC Managed Care – PPO | Attending: Emergency Medicine | Admitting: Emergency Medicine

## 2021-11-29 ENCOUNTER — Other Ambulatory Visit: Payer: Self-pay

## 2021-11-29 ENCOUNTER — Other Ambulatory Visit (HOSPITAL_BASED_OUTPATIENT_CLINIC_OR_DEPARTMENT_OTHER): Payer: Self-pay

## 2021-11-29 DIAGNOSIS — G894 Chronic pain syndrome: Secondary | ICD-10-CM | POA: Insufficient documentation

## 2021-11-29 DIAGNOSIS — M545 Low back pain, unspecified: Secondary | ICD-10-CM | POA: Diagnosis not present

## 2021-11-29 MED ORDER — MELOXICAM 7.5 MG PO TABS
7.5000 mg | ORAL_TABLET | Freq: Every day | ORAL | 0 refills | Status: DC
  Filled 2021-11-29: qty 7, 7d supply, fill #0

## 2021-11-29 MED ORDER — PREDNISONE 20 MG PO TABS
40.0000 mg | ORAL_TABLET | Freq: Every day | ORAL | 0 refills | Status: DC
  Filled 2021-11-29: qty 8, 4d supply, fill #0

## 2021-11-29 MED ORDER — DEXAMETHASONE SODIUM PHOSPHATE 4 MG/ML IJ SOLN
4.0000 mg | Freq: Once | INTRAMUSCULAR | Status: AC
  Administered 2021-11-29: 4 mg via INTRAMUSCULAR
  Filled 2021-11-29: qty 1

## 2021-11-29 MED ORDER — KETOROLAC TROMETHAMINE 60 MG/2ML IM SOLN
60.0000 mg | Freq: Once | INTRAMUSCULAR | Status: AC
  Administered 2021-11-29: 60 mg via INTRAMUSCULAR
  Filled 2021-11-29: qty 2

## 2021-11-29 MED ORDER — METHOCARBAMOL 500 MG PO TABS
1000.0000 mg | ORAL_TABLET | Freq: Once | ORAL | Status: AC
  Administered 2021-11-29: 1000 mg via ORAL
  Filled 2021-11-29: qty 2

## 2021-11-29 MED ORDER — METHOCARBAMOL 500 MG PO TABS
500.0000 mg | ORAL_TABLET | Freq: Two times a day (BID) | ORAL | 0 refills | Status: DC
  Filled 2021-11-29: qty 14, 7d supply, fill #0

## 2021-11-29 NOTE — ED Triage Notes (Signed)
Pt c/o recurrent chronic lower back pain spasms for x1 day. Denies recent injury. Pain unrelieved by tylenol/ibuprofen.

## 2021-11-29 NOTE — ED Provider Notes (Signed)
[facility] EMERGENCY DEPARTMENT Provider Note   CSN: [id number] Arrival date & time: 11/29/21  [id number]     History  Chief Complaint  Patient presents with   Back Pain    Carlos E Gruber Sr. is a 51 y.o. male.  The history is provided by the patient.  Back Pain Location:  Sacro-iliac joint and gluteal region Quality: spasms. Radiates to:  Does not radiate Pain severity:  Moderate Pain is:  Same all the time Onset quality:  Gradual Duration:  1 day Timing:  Constant Progression:  Unchanged Chronicity: acute on chronic. Context: not falling, not jumping from heights and not MVA   Relieved by:  Nothing Worsened by:  Nothing Ineffective treatments:  Ibuprofen Associated symptoms: no fever, no leg pain, no perianal numbness and no weakness   Risk factors: no steroid use   Patient with chronic pain syndrome who presents with with exacerbation of low back pain.  No known trauma.  No leg pain.  No weakness.  No bowel or bladder symptoms.    Past Medical History:  Diagnosis Date   Anxiety and depression    Chronic pain syndrome    Depression    Migraine    Pain in joint, ankle and foot 10/19/2012   Pneumonia    Reflux    Scoliosis         Home Medications Prior to Admission medications   Medication Sig Start Date End Date Taking? Authorizing Provider  meloxicam (MOBIC) 7.5 MG tablet Take 1 tablet (7.5 mg total) by mouth daily. 11/29/21  Yes Eilee Schader, MD  methocarbamol (ROBAXIN) 500 MG tablet Take 1 tablet (500 mg total) by mouth 2 (two) times daily. 11/29/21  Yes Cordarious Zeek, MD  predniSONE (DELTASONE) 20 MG tablet 2 tabs po daily x 4 days 11/29/21  Yes Jamare Vanatta, MD  albuterol (PROAIR HFA) 108 (90 Base) MCG/ACT inhaler INHALE TWO PUFFS BY MOUTH EVERY 6 HOURS AS NEEDED FOR WHEEZING 06/25/17   [provider]  HYDROcodone-acetaminophen (NORCO) 5-325 MG tablet Take 1 tablet by mouth every 6 (six) hours as needed for moderate pain. 07/24/17    Hudnall, Sharyn Lull, MD  ibuprofen (ADVIL) 800 MG tablet Take 1 tablet (800 mg total) by mouth 3 (three) times daily. 09/12/21   Prosperi, Christian H, PA-C  lamoTRIgine (LAMICTAL) 100 MG tablet Take by mouth. 11/22/15   [provider]  methocarbamol (ROBAXIN) 500 MG tablet Take 1 tablet (500 mg total) by mouth 2 (two) times daily. 09/12/21   Prosperi, Christian H, PA-C  naproxen (NAPROSYN) 500 MG tablet Take 1 tablet (500 mg total) by mouth 2 (two) times daily as needed. 09/08/20   Rancour, Annie Main, MD  oxyCODONE-acetaminophen (PERCOCET) 7.5-325 MG tablet Take 1 tablet by mouth every 6 (six) hours as needed. 09/14/17   Sheard, Myeong O, DPM  promethazine-dextromethorphan (PROMETHAZINE-DM) 6.25-15 MG/5ML syrup Take 5 mLs by mouth at bedtime and may repeat dose one time if needed. 01/06/18   Tacy Learn, PA-C  traMADol (ULTRAM) 50 MG tablet Take 1 tablet (50 mg total) by mouth every 6 (six) hours as needed. 05/16/20   Lennice Sites, DO      Allergies    Patient has no known allergies.    Review of Systems   Review of Systems  Constitutional:  Negative for fever.  HENT:  Negative for facial swelling.   Eyes:  Negative for redness.  Genitourinary:  Negative for difficulty urinating.  Musculoskeletal:  Positive for back pain.  Neurological:  Negative for weakness.  All other systems reviewed and are negative.   Physical Exam Updated Vital Signs BP 134/89   Pulse 65   Temp 97.8 F (36.6 C) (Oral)   Resp 15   Ht 5\' 7"  (1.702 m)   Wt 94.3 kg   SpO2 98%   BMI 32.56 kg/m  Physical Exam Vitals and nursing note reviewed.  Constitutional:      General: He is not in acute distress.    Appearance: He is well-developed. He is not diaphoretic.  HENT:     Head: Normocephalic and atraumatic.     Nose: Nose normal.  Eyes:     Conjunctiva/sclera: Conjunctivae normal.     Pupils: Pupils are equal, round, and reactive to light.  Cardiovascular:     Rate and Rhythm: Normal rate and  regular rhythm.     Pulses: Normal pulses.     Heart sounds: Normal heart sounds.  Pulmonary:     Effort: Pulmonary effort is normal.     Breath sounds: Normal breath sounds. No wheezing or rales.  Abdominal:     General: Bowel sounds are normal.     Palpations: Abdomen is soft.     Tenderness: There is no abdominal tenderness. There is no guarding or rebound.  Musculoskeletal:        General: Normal range of motion.     Cervical back: Normal range of motion and neck supple. No spasms, torticollis, tenderness or bony tenderness.     Thoracic back: No lacerations or spasms. Normal range of motion.     Lumbar back: No spasms, tenderness or bony tenderness. No scoliosis.       Back:  Skin:    General: Skin is warm and dry.     Capillary Refill: Capillary refill takes less than 2 seconds.  Neurological:     General: No focal deficit present.     Mental Status: He is alert and oriented to person, place, and time.  Psychiatric:        Mood and Affect: Mood normal.     ED Results / Procedures / Treatments   Labs (all labs ordered are listed, but only abnormal results are displayed) Labs Reviewed - No data to display  EKG None  Radiology No results found.  Procedures Procedures    Medications Ordered in ED Medications  ketorolac (TORADOL) injection 60 mg (has no administration in time range)  methocarbamol (ROBAXIN) tablet 1,000 mg (has no administration in time range)  dexamethasone (DECADRON) injection 4 mg (has no administration in time range)    ED Course/ Medical Decision Making/ A&P                           Medical Decision Making Patient with chronic back and acute exacerbation   Amount and/or Complexity of Data Reviewed External Data Reviewed: notes.    Details: Previous ED notes reviewed   Risk Prescription drug management. Risk Details: Well appearing.  Spine is non-tender, no step offs.  Pain is below level of the spine.  Seems muscular in nature.   Will treat with steroids and muscle relaxants.  Close follow up with PMD.  Strict return.      Final Clinical Impression(s) / ED Diagnoses Final diagnoses:  Low back pain, unspecified back pain laterality, unspecified chronicity, unspecified whether sciatica present  Return for intractable cough, coughing up blood, fevers > 100.4 unrelieved by medication, shortness of breath, intractable vomiting,  chest pain, shortness of breath, weakness, numbness, changes in speech, facial asymmetry, abdominal pain, passing out, Inability to tolerate liquids or food, cough, altered mental status or any concerns. No signs of systemic illness or infection. The patient is nontoxic-appearing on exam and vital signs are within normal limits.  I have reviewed the triage vital signs and the nursing notes. Pertinent labs & imaging results that were available during my care of the patient were reviewed by me and considered in my medical decision making (see chart for details). After history, exam, and medical workup I feel the patient has been appropriately medically screened and is safe for discharge home. Pertinent diagnoses were discussed with the patient. Patient was given return precautions.   Rx / DC Orders ED Discharge Orders          Ordered    predniSONE (DELTASONE) 20 MG tablet        11/29/21 0425    methocarbamol (ROBAXIN) 500 MG tablet  2 times daily        11/29/21 0425    meloxicam (MOBIC) 7.5 MG tablet  Daily        11/29/21 0425              Tymira Horkey, MD 11/29/21 3403

## 2022-03-21 ENCOUNTER — Other Ambulatory Visit: Payer: Self-pay

## 2022-06-17 ENCOUNTER — Emergency Department (HOSPITAL_BASED_OUTPATIENT_CLINIC_OR_DEPARTMENT_OTHER)
Admission: EM | Admit: 2022-06-17 | Discharge: 2022-06-17 | Disposition: A | Discharge status: Home / Self Care | Payer: BC Managed Care – PPO | Attending: Emergency Medicine | Admitting: Emergency Medicine

## 2022-06-17 ENCOUNTER — Emergency Department (HOSPITAL_BASED_OUTPATIENT_CLINIC_OR_DEPARTMENT_OTHER): Payer: BC Managed Care – PPO

## 2022-06-17 ENCOUNTER — Encounter (HOSPITAL_BASED_OUTPATIENT_CLINIC_OR_DEPARTMENT_OTHER): Payer: Self-pay

## 2022-06-17 ENCOUNTER — Other Ambulatory Visit: Payer: Self-pay

## 2022-06-17 DIAGNOSIS — R109 Unspecified abdominal pain: Secondary | ICD-10-CM | POA: Insufficient documentation

## 2022-06-17 MED ORDER — IBUPROFEN 800 MG PO TABS: 800.0000 mg | ORAL_TABLET | Freq: Three times a day (TID) | ORAL | 0 refills | Status: DC | PRN

## 2022-06-17 MED ORDER — METHOCARBAMOL 500 MG PO TABS
1000.0000 mg | ORAL_TABLET | Freq: Once | ORAL | Status: AC
  Administered 2022-06-17: 1000 mg via ORAL
  Filled 2022-06-17: qty 2

## 2022-06-17 MED ORDER — HYDROMORPHONE HCL 1 MG/ML IJ SOLN
1.0000 mg | Freq: Once | INTRAMUSCULAR | Status: AC
  Administered 2022-06-17: 1 mg via INTRAMUSCULAR
  Filled 2022-06-17: qty 1

## 2022-06-17 MED ORDER — KETOROLAC TROMETHAMINE 60 MG/2ML IM SOLN
60.0000 mg | Freq: Once | INTRAMUSCULAR | Status: AC
  Administered 2022-06-17: 60 mg via INTRAMUSCULAR
  Filled 2022-06-17: qty 2

## 2022-06-17 MED ORDER — LIDOCAINE 5 % EX PTCH: 1.0000 | MEDICATED_PATCH | CUTANEOUS | 0 refills | Status: DC

## 2022-06-17 NOTE — ED Provider Notes (Signed)
[facility]GENC[facility] DEPART[facility] AT [facility] Provider Note   CSN: [id number] Arrival date & time: 06/17/22  0405     History  Chief Complaint  Patient presents with   Back Pain    Carlos E Ozer Sr. is a 52 y.o. male.  The history is provided by the patient and medical records.  Back Pain Carlos E Villarin Sr. is a 52 y.o. male who presents to the Emergency Department complaining of flank pain.  He has a history of scoliosis and presents to the emergency department for evaluation of right flank pain that has been present for 3 months but woke him from sleep today which is why he presented.  Pain is described as sharp and has associated nausea.  Pain waxes and wanes.  It is particularly worse with movement.  She does work as a Paediatric nurse.  Pain is also described as a spasm that will not release.  No fever, injuries.  No vomiting, numbness, weakness, loss of bowel or bladder, constipation, diarrhea.  He has taken acetaminophen, ibuprofen, Flexeril and Mobic at home without significant improvement in symptoms.     Home Medications Prior to Admission medications   Medication Sig Start Date End Date Taking? Authorizing Provider  ibuprofen (ADVIL) 800 MG tablet Take 1 tablet (800 mg total) by mouth every 8 (eight) hours as needed. 06/17/22  Yes Tilden Fossa, MD  lidocaine (LIDODERM) 5 % Place 1 patch onto the skin daily. Remove & Discard patch within 12 hours or as directed by MD 06/17/22  Yes Tilden Fossa, MD  albuterol [facility] HFA) 108 (90 Base) MCG/ACT inhaler INHALE TWO PUFFS BY MOUTH EVERY 6 HOURS AS NEEDED FOR WHEEZING 06/25/17   [provider]  HYDROcodone-acetaminophen (NORCO) 5-325 MG tablet Take 1 tablet by mouth every 6 (six) hours as needed for moderate pain. 07/24/17   Lenda Kelp, MD  lamoTRIgine (LAMICTAL) 100 MG tablet Take by mouth. 11/22/15   [provider]  meloxicam (MOBIC) 7.5 MG tablet Take 1 tablet (7.5 mg total) by mouth daily. 11/29/21    Palumbo, April, MD  methocarbamol (ROBAXIN) 500 MG tablet Take 1 tablet (500 mg total) by mouth 2 (two) times daily. 09/12/21   Prosperi, Christian H, PA-C  methocarbamol (ROBAXIN) 500 MG tablet Take 1 tablet (500 mg total) by mouth 2 (two) times daily. 11/29/21   Palumbo, April, MD  naproxen (NAPROSYN) 500 MG tablet Take 1 tablet (500 mg total) by mouth 2 (two) times daily as needed. 09/08/20   Rancour, Jeannett Senior, MD  oxyCODONE-acetaminophen (PERCOCET) 7.5-325 MG tablet Take 1 tablet by mouth every 6 (six) hours as needed. 09/14/17   Sheard, Myeong O, DPM  predniSONE (DELTASONE) 20 MG tablet Take 2 tablets (40 mg total) by mouth daily for 4 days 11/29/21   Nicanor Alcon, April, MD  promethazine-dextromethorphan (PROMETHAZINE-DM) 6.25-15 MG/5ML syrup Take 5 mLs by mouth at bedtime and may repeat dose one time if needed. 01/06/18   Jeannie Fend, PA-C  traMADol (ULTRAM) 50 MG tablet Take 1 tablet (50 mg total) by mouth every 6 (six) hours as needed. 05/16/20   Virgina Norfolk, DO      Allergies    Patient has no known allergies.    Review of Systems   Review of Systems  Musculoskeletal:  Positive for back pain.  All other systems reviewed and are negative.   Physical Exam Updated Vital Signs BP 125/66   Pulse 71   Temp 98 F (36.7 C) (Oral)  Resp 18   Ht 5\' 7"  (1.702 m)   Wt 98 kg   SpO2 97%   BMI 33.83 kg/m  Physical Exam Vitals and nursing note reviewed.  Constitutional:      Appearance: He is well-developed.  HENT:     Head: Normocephalic and atraumatic.  Cardiovascular:     Rate and Rhythm: Normal rate and regular rhythm.     Heart sounds: No murmur heard. Pulmonary:     Effort: Pulmonary effort is normal. No respiratory distress.     Breath sounds: Normal breath sounds.  Abdominal:     Palpations: Abdomen is soft.     Tenderness: There is no abdominal tenderness. There is no guarding or rebound.  Musculoskeletal:     Comments: Scoliosis to the mid back.  TTP and muscle spasm  over right flank. 2+ DP pulses bilaterally  Skin:    General: Skin is warm and dry.  Neurological:     Mental Status: He is alert and oriented to person, place, and time.     Comments: 5/5 strength in BLE with sensation to light touch intact in BLE  Psychiatric:        Behavior: Behavior normal.     ED Results / Procedures / Treatments   Labs (all labs ordered are listed, but only abnormal results are displayed) Labs Reviewed - No data to display  EKG None  Radiology CT Renal Stone Study  Result Date: 06/17/2022 CLINICAL DATA:  Abdominal/flank pain.  Stone suspected. EXAM: CT ABDOMEN AND PELVIS WITHOUT CONTRAST TECHNIQUE: Multidetector CT imaging of the abdomen and pelvis was performed following the standard protocol without IV contrast. RADIATION DOSE REDUCTION: This exam was performed according to the departmental dose-optimization program which includes automated exposure control, adjustment of the mA and/or kV according to patient size and/or use of iterative reconstruction technique. COMPARISON:  CT without contrast 09/08/2020, CT with contrast 01/09/2015 FINDINGS: Lower chest: No acute abnormality. Again noted is a subcutaneous thin walled 1.9 cm cystic lesion of the anterolateral lower left chest wall, Hounsfield density of -2.1, was previously 1.4 cm. Probable sebaceous cyst. Hepatobiliary: There is homogeneous noncontrast attenuation within the liver. The gallbladder and bile ducts are unremarkable. Pancreas: Unremarkable without contrast. Spleen: Unremarkable without contrast. Adrenals/Urinary Tract: There is a 1.5 cm right adrenal adenoma, Hounsfield density of -1, unchanged. There is no left adrenal mass. There is no contour deforming abnormality of either kidney. The unenhanced renal cortex is unremarkable. There is no urinary stone or obstruction. The bladder is contracted and not well seen but there are no perivesical inflammatory changes. Stomach/Bowel: No dilatation or wall  thickening including the appendix. Moderate fecal stasis ascending and transverse colon. There is colonic diverticulosis without evidence of focal diverticulitis or colitis. Greatest of the diverticular disease is in the sigmoid segment. Vascular/Lymphatic: Aortic atherosclerosis. No enlarged abdominal or pelvic lymph nodes. Reproductive: Uterus and bilateral adnexa are unremarkable. Other: There are small umbilical and bilateral inguinal fat hernias. There is no incarcerated hernia. There is no free air, free hemorrhage, free fluid or acute inflammatory change. Musculoskeletal: There is broad-based lower thoracic to lumbar levorotary scoliosis, apex at L1. No acute or other significant osseous findings. IMPRESSION: 1. No acute noncontrast CT abnormality. No urinary stone or obstruction. 2. Constipation and diverticulosis. 3. Aortic atherosclerosis. 4. Small umbilical and bilateral inguinal fat hernias. 5. 1.5 cm right adrenal adenoma. 6. 1.9 cm probable sebaceous cyst in the anterolateral lower left chest wall. Aortic Atherosclerosis (ICD10-I70.0). Electronically Signed  By: Almira Bar M.D.   On: 06/17/2022 06:34    Procedures Procedures    Medications Ordered in ED Medications  methocarbamol (ROBAXIN) tablet 1,000 mg (1,000 mg Oral Given 06/17/22 0559)  ketorolac (TORADOL) injection 60 mg (60 mg Intramuscular Given 06/17/22 0600)  HYDROmorphone (DILAUDID) injection 1 mg (1 mg Intramuscular Given 06/17/22 0600)    ED Course/ Medical Decision Making/ A&P                             Medical Decision Making Amount and/or Complexity of Data Reviewed Radiology: ordered.  Risk Prescription drug management.   Patient with history of scoliosis with chronic back issues here for evaluation of 3 months of progressive right flank pain.  He does have scoliosis on examination with muscle spasm in the right flank.  He is well-perfused and neurologically intact distally.  CT stone study was obtained  given description of pain, which is negative for acute obstructing stone.  Discussed with patient incidental findings on CT scan such as atherosclerosis, adrenal adenoma that will need to be followed up by his PCP.  He is feeling improved after medications administered in the emergency department.  Will prescribe Lidoderm patch, ibuprofen.  He already has Flexeril available.  Discussed outpatient follow-up and return precautions.        Final Clinical Impression(s) / ED Diagnoses Final diagnoses:  Right flank pain    Rx / DC Orders ED Discharge Orders          Ordered    ibuprofen (ADVIL) 800 MG tablet  Every 8 hours PRN        06/17/22 0647    lidocaine (LIDODERM) 5 %  Every 24 hours        06/17/22 0647              Tilden Fossa, MD 06/17/22 [phone]

## 2022-06-17 NOTE — ED Triage Notes (Signed)
Pt has hx of Scoliosis and has intermittent pain for approx 3 months but tonight the pain woke him up from his sleep. Pt took Flexeril and Mobic at home but did not help.

## 2022-06-17 NOTE — Discharge Instructions (Signed)
You had a CT scan performed in the Emergency Department today.  Please follow up with your family doctor for further evaluation.    Narrative & Impression  CLINICAL DATA:  Abdominal/flank pain.  Stone suspected.   EXAM: CT ABDOMEN AND PELVIS WITHOUT CONTRAST   TECHNIQUE: Multidetector CT imaging of the abdomen and pelvis was performed following the standard protocol without IV contrast.   RADIATION DOSE REDUCTION: This exam was performed according to the departmental dose-optimization program which includes automated exposure control, adjustment of the mA and/or kV according to patient size and/or use of iterative reconstruction technique.   COMPARISON:  CT without contrast 09/08/2020, CT with contrast 01/09/2015   FINDINGS: Lower chest: No acute abnormality. Again noted is a subcutaneous thin walled 1.9 cm cystic lesion of the anterolateral lower left chest wall, Hounsfield density of -2.1, was previously 1.4 cm. Probable sebaceous cyst.   Hepatobiliary: There is homogeneous noncontrast attenuation within the liver. The gallbladder and bile ducts are unremarkable.   Pancreas: Unremarkable without contrast.   Spleen: Unremarkable without contrast.   Adrenals/Urinary Tract: There is a 1.5 cm right adrenal adenoma, Hounsfield density of -1, unchanged.   There is no left adrenal mass. There is no contour deforming abnormality of either kidney.   The unenhanced renal cortex is unremarkable. There is no urinary stone or obstruction.   The bladder is contracted and not well seen but there are no perivesical inflammatory changes.   Stomach/Bowel: No dilatation or wall thickening including the appendix. Moderate fecal stasis ascending and transverse colon. There is colonic diverticulosis without evidence of focal diverticulitis or colitis. Greatest of the diverticular disease is in the sigmoid segment.   Vascular/Lymphatic: Aortic atherosclerosis. No enlarged abdominal  or pelvic lymph nodes.   Reproductive: Uterus and bilateral adnexa are unremarkable.   Other: There are small umbilical and bilateral inguinal fat hernias. There is no incarcerated hernia. There is no free air, free hemorrhage, free fluid or acute inflammatory change.   Musculoskeletal: There is broad-based lower thoracic to lumbar levorotary scoliosis, apex at L1. No acute or other significant osseous findings.   IMPRESSION: 1. No acute noncontrast CT abnormality. No urinary stone or obstruction. 2. Constipation and diverticulosis. 3. Aortic atherosclerosis. 4. Small umbilical and bilateral inguinal fat hernias. 5. 1.5 cm right adrenal adenoma. 6. 1.9 cm probable sebaceous cyst in the anterolateral lower left chest wall.

## 2022-06-17 NOTE — ED Notes (Signed)
Patient transported to CT 

## 2022-10-03 ENCOUNTER — Encounter (HOSPITAL_BASED_OUTPATIENT_CLINIC_OR_DEPARTMENT_OTHER): Payer: Self-pay

## 2022-10-03 ENCOUNTER — Emergency Department (HOSPITAL_BASED_OUTPATIENT_CLINIC_OR_DEPARTMENT_OTHER): Payer: BC Managed Care – PPO

## 2022-10-03 ENCOUNTER — Emergency Department (HOSPITAL_BASED_OUTPATIENT_CLINIC_OR_DEPARTMENT_OTHER)
Admission: EM | Admit: 2022-10-03 | Discharge: 2022-10-03 | Disposition: A | Discharge status: Home / Self Care | Payer: BC Managed Care – PPO | Attending: Emergency Medicine | Admitting: Emergency Medicine

## 2022-10-03 ENCOUNTER — Other Ambulatory Visit: Payer: Self-pay

## 2022-10-03 DIAGNOSIS — R519 Headache, unspecified: Secondary | ICD-10-CM | POA: Diagnosis present

## 2022-10-03 LAB — CBC WITH DIFFERENTIAL/PLATELET
Abs Immature Granulocytes: 0.03 10*3/uL (ref 0.00–0.07)
Basophils Absolute: 0.1 10*3/uL (ref 0.0–0.1)
Basophils Relative: 1 %
Eosinophils Absolute: 0.1 10*3/uL (ref 0.0–0.5)
Eosinophils Relative: 1 %
HCT: 39.9 % (ref 39.0–52.0)
Hemoglobin: 13.9 g/dL (ref 13.0–17.0)
Immature Granulocytes: 0 %
Lymphocytes Relative: 37 %
Lymphs Abs: 3.6 10*3/uL (ref 0.7–4.0)
MCH: 31 pg (ref 26.0–34.0)
MCHC: 34.8 g/dL (ref 30.0–36.0)
MCV: 88.9 fL (ref 80.0–100.0)
Monocytes Absolute: 0.6 10*3/uL (ref 0.1–1.0)
Monocytes Relative: 7 %
Neutro Abs: 5.4 10*3/uL (ref 1.7–7.7)
Neutrophils Relative %: 54 %
Platelets: 379 10*3/uL (ref 150–400)
RBC: 4.49 MIL/uL (ref 4.22–5.81)
RDW: 12.6 % (ref 11.5–15.5)
WBC: 9.8 10*3/uL (ref 4.0–10.5)
nRBC: 0 % (ref 0.0–0.2)

## 2022-10-03 LAB — COMPREHENSIVE METABOLIC PANEL
ALT: 31 U/L (ref 0–44)
AST: 25 U/L (ref 15–41)
Albumin: 4.4 g/dL (ref 3.5–5.0)
Alkaline Phosphatase: 75 U/L (ref 38–126)
Anion gap: 14 (ref 5–15)
BUN: 10 mg/dL (ref 6–20)
CO2: 23 mmol/L (ref 22–32)
Calcium: 9.7 mg/dL (ref 8.9–10.3)
Chloride: 101 mmol/L (ref 98–111)
Creatinine, Ser: 0.85 mg/dL (ref 0.61–1.24)
GFR, Estimated: >60 mL/min (ref >60)
Glucose, Bld: 116 mg/dL — ABNORMAL HIGH (ref 70–99)
Potassium: 3.6 mmol/L (ref 3.5–5.1)
Sodium: 138 mmol/L (ref 135–145)
Total Bilirubin: 0.4 mg/dL (ref 0.3–1.2)
Total Protein: 7.4 g/dL (ref 6.5–8.1)

## 2022-10-03 LAB — TROPONIN I (HIGH SENSITIVITY): Troponin I (High Sensitivity): 3 ng/L (ref <18)

## 2022-10-03 MED ORDER — LACTATED RINGERS IV BOLUS
1000.0000 mL | Freq: Once | INTRAVENOUS | Status: AC
  Administered 2022-10-03: 1000 mL via INTRAVENOUS

## 2022-10-03 MED ORDER — IOHEXOL 350 MG/ML SOLN
75.0000 mL | Freq: Once | INTRAVENOUS | Status: AC | PRN
  Administered 2022-10-03: 75 mL via INTRAVENOUS

## 2022-10-03 MED ORDER — DIPHENHYDRAMINE HCL 50 MG/ML IJ SOLN
50.0000 mg | Freq: Once | INTRAMUSCULAR | Status: AC
  Administered 2022-10-03: 50 mg via INTRAVENOUS
  Filled 2022-10-03: qty 1

## 2022-10-03 MED ORDER — BUTALBITAL-APAP-CAFFEINE 50-325-40 MG PO TABS: 1.0000 | ORAL_TABLET | Freq: Four times a day (QID) | ORAL | 0 refills | Status: DC | PRN

## 2022-10-03 MED ORDER — KETOROLAC TROMETHAMINE 15 MG/ML IJ SOLN
30.0000 mg | Freq: Once | INTRAMUSCULAR | Status: AC
  Administered 2022-10-03: 30 mg via INTRAVENOUS
  Filled 2022-10-03: qty 2

## 2022-10-03 MED ORDER — DEXAMETHASONE SODIUM PHOSPHATE 10 MG/ML IJ SOLN
6.0000 mg | Freq: Once | INTRAMUSCULAR | Status: AC
  Administered 2022-10-03: 6 mg via INTRAVENOUS
  Filled 2022-10-03: qty 1

## 2022-10-03 MED ORDER — PROCHLORPERAZINE EDISYLATE 10 MG/2ML IJ SOLN
10.0000 mg | Freq: Once | INTRAMUSCULAR | Status: AC
Start: 2022-10-03 — End: 2022-10-03
  Administered 2022-10-03: 10 mg via INTRAVENOUS
  Filled 2022-10-03: qty 2

## 2022-10-03 NOTE — ED Triage Notes (Signed)
Patient complaining of a migraine since yesterday. He had a spinal injection Monday. He denied fever.

## 2022-10-03 NOTE — ED Provider Notes (Signed)
[facility] Provider Note   CSN: [id number] Arrival date & time: 10/03/22  1718     History Chief Complaint  Patient presents with   Migraine    HPI Carlos E Gill Sr. is a 52 y.o. male presenting for chief complaint of headache.   Patient's recorded medical, surgical, social, medication list and allergies were reviewed in the Snapshot window as part of the initial history.   Review of Systems   Review of Systems  Constitutional:  Negative for chills and fever.  HENT:  Negative for ear pain and sore throat.   Eyes:  Negative for pain and visual disturbance.  Respiratory:  Negative for cough and shortness of breath.   Cardiovascular:  Negative for chest pain and palpitations.  Gastrointestinal:  Negative for abdominal pain and vomiting.  Genitourinary:  Negative for dysuria and hematuria.  Musculoskeletal:  Negative for arthralgias and back pain.  Skin:  Negative for color change and rash.  Neurological:  Positive for headaches. Negative for seizures and syncope.  All other systems reviewed and are negative.   Physical Exam Updated Vital Signs BP (!) 130/92   Pulse 69   Temp 98 F (36.7 C) (Oral)   Resp (!) 23   Ht 5\' 7"  (1.702 m)   Wt 98 kg   SpO2 95%   BMI 33.84 kg/m  Physical Exam Vitals and nursing note reviewed.  Constitutional:      General: He is not in acute distress.    Appearance: He is well-developed.  HENT:     Head: Normocephalic and atraumatic.  Eyes:     Conjunctiva/sclera: Conjunctivae normal.  Cardiovascular:     Rate and Rhythm: Normal rate and regular rhythm.     Heart sounds: No murmur heard. Pulmonary:     Effort: Pulmonary effort is normal. No respiratory distress.     Breath sounds: Normal breath sounds.  Abdominal:     Palpations: Abdomen is soft.     Tenderness: There is no abdominal tenderness.  Musculoskeletal:        General: No swelling.     Cervical back: Neck supple.  Skin:     General: Skin is warm and dry.     Capillary Refill: Capillary refill takes less than 2 seconds.  Neurological:     Mental Status: He is alert.  Psychiatric:        Mood and Affect: Mood normal.      ED Course/ Medical Decision Making/ A&P    Procedures Procedures   Medications Ordered in ED Medications  ketorolac (TORADOL) 15 MG/ML injection 30 mg (has no administration in time range)  prochlorperazine (COMPAZINE) injection 10 mg (10 mg Intravenous Given 10/03/22 1906)  diphenhydrAMINE (BENADRYL) injection 50 mg (50 mg Intravenous Given 10/03/22 1903)  lactated ringers bolus 1,000 mL (1,000 mLs Intravenous New Bag/Given 10/03/22 1901)  dexamethasone (DECADRON) injection 6 mg (6 mg Intravenous Given 10/03/22 1902)  iohexol (OMNIPAQUE) 350 MG/ML injection 75 mL (75 mLs Intravenous Contrast Given 10/03/22 1937)   Medical Decision Making:   Carlos Bors Morell Sr. is a 52 y.o. male who presented to the ED today with a chief complaint of headache detailed above.    Patient placed on continuous vitals and telemetry monitoring while in ED which was reviewed periodically.   Complete initial physical exam performed, notably the patient  was HDS in NAD.    Reviewed and confirmed nursing documentation for past medical history, family history, social history.  Initial Assessment:   With the patient's presentation of headache, most likely diagnosis is tension type headaches vs atypical migraines. Other diagnoses were considered including (but not limited to) intracranial mass, intracranial hemorrhage, intracranial infection including meningitis vs encephalitis, GCA, trigeminal neuralgia. These are considered less likely due to history of present illness and physical exam findings.    This is most consistent with an acute life/limb threatening illness complicated by underlying chronic conditions.   Timeline and slow onset is not consistent with SAH/ICH   Age and description of pain is not consistent  with GCA   Lack of fever,meningismus is not consistent with meningitis/encephalitis   Initial Plan:  Will initiate treatment with Compazine/Benardryll/NSAIDS/Tylenol for treatment of nonspecific headache  Screening labs including CBC and Metabolic panel to evaluate for infectious or metabolic etiology of disease.  Urinalysis with reflex culture ordered to evaluate for UTI or relevant urologic/nephrologic pathology.  CTH with follow-up CTA to evaluate for structural IC etiology.  Unfortunately time since initial headache presentation is greater than 6 hours the patient require angiography.  Will include the neck because of some occipital involvement of the headache to rule out vascular dissection in this location as well Objective evaluation as below reviewed   Initial Study Results:   Laboratory  All laboratory results reviewed without evidence of clinically relevant pathology.    EKG EKG was reviewed independently. Rate, rhythm, axis, intervals all examined and without medically relevant abnormality. ST segments without concerns for elevations.    Radiology:  All images reviewed independently. Agree with radiology report at this time.   CT ANGIO HEAD NECK W WO CM  Final Result        Final Assessment and Plan:   Reassessed patient after 3 hours in the emergency room.  Symptoms grossly improved he is resting comfortably.  Not in any acute distress. Objective findings reassuring.  Will add on Toradol for further treatment of headache tonight, prescribe Fioricet in case patient has a rebound headache and refer patient to neurology. Discussed with patient and they are in agreement with outpatient care management at this time given interval improvement.  Clinical Impression:  1. Acute nonintractable headache, unspecified headache type      Discharge   Final Clinical Impression(s) / ED Diagnoses Final diagnoses:  Acute nonintractable headache, unspecified headache type    Rx / DC  Orders ED Discharge Orders          Ordered    butalbital-acetaminophen-caffeine (FIORICET) 50-325-40 MG tablet  Every 6 hours PRN        10/03/22 2036    Ambulatory referral to Neurology       Comments: An appointment is requested in approximately: 1 week   10/03/22 2042              Glyn Ade, MD 10/03/22 2042

## 2022-10-14 NOTE — Progress Notes (Unsigned)
Referring:  Glyn Ade, MD [address] [place],  [place] [address]  PCP: Raynelle Jan., MD  Neurology was asked to evaluate [facility] for a chief complaint of headaches.  Our recommendations of care will be communicated by shared medical record.    CC:  headaches  History provided from self  HPI:  Medical co-morbidities: asthma, OSA, HLD, congenital nystagmus  The patient presents for evaluation of headaches. He had migraines ~20 years ago which had improved until recently. Had been migraine-free since 2004, until he woke up with a migraine on 10/03/22. This was associated with photophobia and phonophobia. Notes he was working on cutting energy drinks out of his diet at the time. Presented to the ED where [facility] and CTA head/neck were unremarkable.  He takes Excedrin migraine for rescue. Has taken this 2-3 times since his ED visit. Has had a few headaches, but none have progressed to full-blown migraines.   Headache History: Onset: 10/03/22 Aura: no Location: holocephalic Quality/Description: throbbing Associated Symptoms:  Photophobia: yes  Phonophobia: yes  Nausea: no Worse with activity?: yes Duration of headaches: up to 2 days  Headache days per month: 4 Migraine days per month: 1 Headache free days per month: 26  Current Treatment: Abortive Excedrin  Preventative none  Prior Therapies                                 Lamictal 150 mg Gabapentin 600 mg TID robaxin   LABS: CBC    Component Value Date/Time   WBC 9.8 10/03/2022 1854   RBC 4.49 10/03/2022 1854   HGB 13.9 10/03/2022 1854   HCT 39.9 10/03/2022 1854   PLT 379 10/03/2022 1854   MCV 88.9 10/03/2022 1854   MCH 31.0 10/03/2022 1854   MCHC 34.8 10/03/2022 1854   RDW 12.6 10/03/2022 1854   LYMPHSABS 3.6 10/03/2022 1854   MONOABS 0.6 10/03/2022 1854   EOSABS 0.1 10/03/2022 1854   BASOSABS 0.1 10/03/2022 1854      Latest Ref Rng & Units 10/03/2022    6:54 PM 03/01/2021   11:11 PM  06/18/2015   12:15 AM  CMP  Glucose 70 - 99 mg/dL 604  540  99   BUN 6 - 20 mg/dL 10  13  13    Creatinine 0.61 - 1.24 mg/dL 9.81  1.91  4.78   Sodium 135 - 145 mmol/L 138  139  140   Potassium 3.5 - 5.1 mmol/L 3.6  3.8  4.2   Chloride 98 - 111 mmol/L 101  106  107   CO2 22 - 32 mmol/L 23  25  27    Calcium 8.9 - 10.3 mg/dL 9.7  9.2  9.3   Total Protein 6.5 - 8.1 g/dL 7.4     Total Bilirubin 0.3 - 1.2 mg/dL 0.4     Alkaline Phos 38 - 126 U/L 75     AST 15 - 41 U/L 25     ALT 0 - 44 U/L 31        IMAGING:  CTH, CTA head/neck 10/03/22: unremarkable  Imaging independently reviewed on October 15, 2022   Current Outpatient Medications on File Prior to Visit  Medication Sig Dispense Refill   atorvastatin (LIPITOR) 10 MG tablet Take 10 mg by mouth daily.     lamoTRIgine (LAMICTAL) 100 MG tablet Take by mouth. 150 mg     naproxen (NAPROSYN) 500 MG tablet  Take 1 tablet (500 mg total) by mouth 2 (two) times daily as needed. 30 tablet 0   albuterol (PROAIR HFA) 108 (90 Base) MCG/ACT inhaler INHALE TWO PUFFS BY MOUTH EVERY 6 HOURS AS NEEDED FOR WHEEZING (Patient not taking: Reported on 10/15/2022)     butalbital-acetaminophen-caffeine (FIORICET) 50-325-40 MG tablet Take 1-2 tablets by mouth every 6 (six) hours as needed for headache. (Patient not taking: Reported on 10/15/2022) 20 tablet 0   HYDROcodone-acetaminophen (NORCO) 5-325 MG tablet Take 1 tablet by mouth every 6 (six) hours as needed for moderate pain. (Patient not taking: Reported on 10/15/2022) 20 tablet 0   ibuprofen (ADVIL) 800 MG tablet Take 1 tablet (800 mg total) by mouth every 8 (eight) hours as needed. (Patient not taking: Reported on 10/15/2022) 21 tablet 0   lidocaine (LIDODERM) 5 % Place 1 patch onto the skin daily. Remove & Discard patch within 12 hours or as directed by MD (Patient not taking: Reported on 10/15/2022) 14 patch 0   meloxicam (MOBIC) 7.5 MG tablet Take 1 tablet (7.5 mg total) by mouth daily. (Patient not taking:  Reported on 10/15/2022) 7 tablet 0   methocarbamol (ROBAXIN) 500 MG tablet Take 1 tablet (500 mg total) by mouth 2 (two) times daily. (Patient not taking: Reported on 10/15/2022) 20 tablet 0   methocarbamol (ROBAXIN) 500 MG tablet Take 1 tablet (500 mg total) by mouth 2 (two) times daily. (Patient not taking: Reported on 10/15/2022) 14 tablet 0   oxyCODONE-acetaminophen (PERCOCET) 7.5-325 MG tablet Take 1 tablet by mouth every 6 (six) hours as needed. (Patient not taking: Reported on 10/15/2022) 28 tablet 0   predniSONE (DELTASONE) 20 MG tablet Take 2 tablets (40 mg total) by mouth daily for 4 days (Patient not taking: Reported on 10/15/2022) 8 tablet 0   promethazine-dextromethorphan (PROMETHAZINE-DM) 6.25-15 MG/5ML syrup Take 5 mLs by mouth at bedtime and may repeat dose one time if needed. (Patient not taking: Reported on 10/15/2022) 118 mL 0   traMADol (ULTRAM) 50 MG tablet Take 1 tablet (50 mg total) by mouth every 6 (six) hours as needed. (Patient not taking: Reported on 10/15/2022) 15 tablet 0   No current facility-administered medications on file prior to visit.     Allergies: No Known Allergies  Family History: History reviewed. No pertinent family history.   Past Medical History: Past Medical History:  Diagnosis Date   Anxiety and depression    Chronic pain syndrome    Depression    Migraine    Pain in joint, ankle and foot 10/19/2012   Pneumonia    Reflux    Scoliosis     Past Surgical History Past Surgical History:  Procedure Laterality Date   ANKLE SURGERY     ARTHROSCOPIC REPAIR ACL     FOOT SURGERY      Social History: Social History   Tobacco Use   Smoking status: Former    Types: Cigarettes   Smokeless tobacco: Never  Vaping Use   Vaping status: Never Used  Substance Use Topics   Alcohol use: No   Drug use: No    ROS: Negative for fevers, chills. Positive for headaches. All other systems reviewed and negative unless stated otherwise in  HPI.   Physical Exam:   Vital Signs: BP (!) 135/91 (BP Location: Left Arm, Patient Position: Sitting, Cuff Size: Normal)   Pulse 76   Ht 5\' 7"  (1.702 m)   Wt 220 lb 9.6 oz (100.1 kg)   BMI 34.55 kg/m  GENERAL:  well appearing,in no acute distress,alert SKIN:  Color, texture, turgor normal. No rashes or lesions HEAD:  Normocephalic/atraumatic. CV:  RRR RESP: Normal respiratory effort MSK: no tenderness to palpation over occiput, neck, or shoulders  NEUROLOGICAL: Mental Status: Alert, oriented to person, place and time,Follows commands Cranial Nerves: PERRL, visual fields intact to confrontation, congenital nystagmus present, decreased sensation left V1-3, no facial droop or ptosis, hearing grossly intact, no dysarthria Motor: muscle strength 5/5 both upper and lower extremities Reflexes: 2+ throughout Sensation: decreased sensation LUE and LLE Coordination: Finger-to- nose-finger intact bilaterally Gait: normal-based   IMPRESSION: 52 year old male with a history of asthma, OSA, HLD who presents for evaluation of headaches. His current headache pattern is consistent with episodic migraine without aura. Will order MRI brain as his exam reveals decreased sensation of the left hemibody.  Imitrex started for migraine rescue.  PLAN: -MRI brain -Start Imitrex 100 mg PRN   I spent a total of 32 minutes chart reviewing and counseling the patient. Headache education was done. Discussed treatment options including acute medications. Discussed medication side effects, adverse reactions and drug interactions. Written educational materials and patient instructions outlining all of the above were given.  Follow-up: 6 months   Ocie Doyne, MD 10/15/2022   1:34 PM

## 2022-10-15 ENCOUNTER — Ambulatory Visit (INDEPENDENT_AMBULATORY_CARE_PROVIDER_SITE_OTHER): Payer: BC Managed Care – PPO | Admitting: Psychiatry

## 2022-10-15 ENCOUNTER — Encounter: Payer: Self-pay | Admitting: Psychiatry

## 2022-10-15 VITALS — BP 135/91 | HR 76 | Ht 67.0 in | Wt 220.6 lb

## 2022-10-15 DIAGNOSIS — G43009 Migraine without aura, not intractable, without status migrainosus: Secondary | ICD-10-CM

## 2022-10-15 MED ORDER — SUMATRIPTAN SUCCINATE 100 MG PO TABS: 100.0000 mg | ORAL_TABLET | ORAL | 6 refills | Status: DC | PRN

## 2022-10-26 ENCOUNTER — Other Ambulatory Visit (HOSPITAL_BASED_OUTPATIENT_CLINIC_OR_DEPARTMENT_OTHER): Payer: Self-pay | Admitting: Emergency Medicine

## 2022-10-29 ENCOUNTER — Other Ambulatory Visit (HOSPITAL_BASED_OUTPATIENT_CLINIC_OR_DEPARTMENT_OTHER): Payer: Self-pay

## 2022-11-30 ENCOUNTER — Emergency Department (HOSPITAL_BASED_OUTPATIENT_CLINIC_OR_DEPARTMENT_OTHER)
Admission: EM | Admit: 2022-11-30 | Discharge: 2022-11-30 | Disposition: A | Discharge status: Home / Self Care | Payer: BC Managed Care – PPO | Attending: Emergency Medicine | Admitting: Emergency Medicine

## 2022-11-30 ENCOUNTER — Encounter (HOSPITAL_BASED_OUTPATIENT_CLINIC_OR_DEPARTMENT_OTHER): Payer: Self-pay | Admitting: Urology

## 2022-11-30 ENCOUNTER — Other Ambulatory Visit: Payer: Self-pay

## 2022-11-30 DIAGNOSIS — M5431 Sciatica, right side: Secondary | ICD-10-CM

## 2022-11-30 DIAGNOSIS — M5441 Lumbago with sciatica, right side: Secondary | ICD-10-CM | POA: Insufficient documentation

## 2022-11-30 DIAGNOSIS — M545 Low back pain, unspecified: Secondary | ICD-10-CM | POA: Diagnosis present

## 2022-11-30 MED ORDER — METHOCARBAMOL 500 MG PO TABS: 500.0000 mg | ORAL_TABLET | Freq: Two times a day (BID) | ORAL | 0 refills | Status: AC

## 2022-11-30 MED ORDER — ACETAMINOPHEN 500 MG PO TABS
1000.0000 mg | ORAL_TABLET | Freq: Once | ORAL | Status: AC
  Administered 2022-11-30: 1000 mg via ORAL
  Filled 2022-11-30: qty 2

## 2022-11-30 MED ORDER — DICLOFENAC SODIUM 1 % EX GEL: 4.0000 g | Freq: Four times a day (QID) | CUTANEOUS | 0 refills | Status: AC

## 2022-11-30 MED ORDER — METHYLPREDNISOLONE 4 MG PO TBPK: ORAL_TABLET | ORAL | 0 refills | Status: DC

## 2022-11-30 MED ORDER — OXYCODONE HCL 5 MG PO TABS
5.0000 mg | ORAL_TABLET | Freq: Once | ORAL | Status: AC
  Administered 2022-11-30: 5 mg via ORAL
  Filled 2022-11-30: qty 1

## 2022-11-30 MED ORDER — KETOROLAC TROMETHAMINE 15 MG/ML IJ SOLN
15.0000 mg | Freq: Once | INTRAMUSCULAR | Status: AC
  Administered 2022-11-30: 15 mg via INTRAMUSCULAR
  Filled 2022-11-30: qty 1

## 2022-11-30 NOTE — ED Triage Notes (Signed)
Pt states chronic lower back pain with  injections 2 months ago States pain worse on right side  Felt sharp pain this am when getting up. States tingling to right leg as well

## 2022-11-30 NOTE — ED Provider Notes (Signed)
[facility] Provider Note   CSN: [id number] Arrival date & time: 11/30/22  1252     History  Chief Complaint  Patient presents with   Back Pain    Carlos E Stansbery Sr. is a 52 y.o. male.  52 yo M with a chief complaints of right-sided low back pain that radiates down the leg.  This has been an ongoing issue for him.  He got spinal injections about 2 months ago and has had no improvement of his discomfort.  Had worsening of his symptoms yesterday and then woke up this morning with it a bit worse than it had been.  No fevers no trauma no loss of bowel or bladder no loss of rectal sensation.  He does have a sensation of numbness mostly along the outer aspect of the right leg.   Back Pain      Home Medications Prior to Admission medications   Medication Sig Start Date End Date Taking? Authorizing Provider  diclofenac Sodium (VOLTAREN) 1 % GEL Apply 4 g topically 4 (four) times daily. 11/30/22  Yes Melene Plan, DO  methocarbamol (ROBAXIN) 500 MG tablet Take 1 tablet (500 mg total) by mouth 2 (two) times daily. 11/30/22  Yes Melene Plan, DO  methylPREDNISolone (MEDROL DOSEPAK) 4 MG TBPK tablet Day 1: 8mg  before breakfast, 4 mg after lunch, 4 mg after supper, and 8 mg at bedtime Day 2: 4 mg before breakfast, 4 mg after lunch, 4 mg  after supper, and 8 mg  at bedtime Day 3:  4 mg  before breakfast, 4 mg  after lunch, 4 mg after supper, and 4 mg  at bedtime Day 4: 4 mg  before breakfast, 4 mg  after lunch, and 4 mg at bedtime Day 5: 4 mg  before breakfast and 4 mg at bedtime Day 6: 4 mg  before breakfast 11/30/22  Yes Melene Plan, DO  albuterol (PROAIR HFA) 108 (90 Base) MCG/ACT inhaler INHALE TWO PUFFS BY MOUTH EVERY 6 HOURS AS NEEDED FOR WHEEZING Patient not taking: Reported on 10/15/2022 06/25/17   [provider]  atorvastatin (LIPITOR) 10 MG tablet Take 10 mg by mouth daily.    [provider]  butalbital-acetaminophen-caffeine  (FIORICET) 50-325-40 MG tablet Take 1-2 tablets by mouth every 6 (six) hours as needed for headache. Patient not taking: Reported on 10/15/2022 10/03/22 10/03/23  Glyn Ade, MD  HYDROcodone-acetaminophen (NORCO) 5-325 MG tablet Take 1 tablet by mouth every 6 (six) hours as needed for moderate pain. Patient not taking: Reported on 10/15/2022 07/24/17   Lenda Kelp, MD  ibuprofen (ADVIL) 800 MG tablet Take 1 tablet (800 mg total) by mouth every 8 (eight) hours as needed. Patient not taking: Reported on 10/15/2022 06/17/22   Tilden Fossa, MD  lamoTRIgine (LAMICTAL) 100 MG tablet Take by mouth. 150 mg 11/22/15   [provider]  lidocaine (LIDODERM) 5 % Place 1 patch onto the skin daily. Remove & Discard patch within 12 hours or as directed by MD Patient not taking: Reported on 10/15/2022 06/17/22   Tilden Fossa, MD  meloxicam (MOBIC) 7.5 MG tablet Take 1 tablet (7.5 mg total) by mouth daily. Patient not taking: Reported on 10/15/2022 11/29/21   Palumbo, April, MD  naproxen (NAPROSYN) 500 MG tablet Take 1 tablet (500 mg total) by mouth 2 (two) times daily as needed. 09/08/20   Rancour, Jeannett Senior, MD  oxyCODONE-acetaminophen (PERCOCET) 7.5-325 MG tablet Take 1 tablet by mouth every 6 (six) hours  as needed. Patient not taking: Reported on 10/15/2022 09/14/17   Sheard, Myeong O, DPM  predniSONE (DELTASONE) 20 MG tablet Take 2 tablets (40 mg total) by mouth daily for 4 days Patient not taking: Reported on 10/15/2022 11/29/21   Palumbo, April, MD  promethazine-dextromethorphan (PROMETHAZINE-DM) 6.25-15 MG/5ML syrup Take 5 mLs by mouth at bedtime and may repeat dose one time if needed. Patient not taking: Reported on 10/15/2022 01/06/18   Army Melia A, PA-C  SUMAtriptan (IMITREX) 100 MG tablet Take 1 tablet (100 mg total) by mouth every 2 (two) hours as needed. May repeat in 2 hours if headache persists or recurs. 10/15/22   Ocie Doyne, MD  traMADol (ULTRAM) 50 MG tablet Take 1 tablet (50 mg  total) by mouth every 6 (six) hours as needed. Patient not taking: Reported on 10/15/2022 05/16/20   Virgina Norfolk, DO      Allergies    Patient has no known allergies.    Review of Systems   Review of Systems  Musculoskeletal:  Positive for back pain.    Physical Exam Updated Vital Signs BP 127/87   Pulse 69   Temp 98.1 F (36.7 C)   Resp 18   Ht 5\' 7"  (1.702 m)   Wt 100.1 kg   SpO2 97%   BMI 34.56 kg/m  Physical Exam Vitals and nursing note reviewed.  Constitutional:      Appearance: He is well-developed.  HENT:     Head: Normocephalic and atraumatic.  Eyes:     Pupils: Pupils are equal, round, and reactive to light.  Neck:     Vascular: No JVD.  Cardiovascular:     Rate and Rhythm: Normal rate and regular rhythm.     Heart sounds: No murmur heard.    No friction rub. No gallop.  Pulmonary:     Effort: No respiratory distress.     Breath sounds: No wheezing.  Abdominal:     General: There is no distension.     Tenderness: There is no abdominal tenderness. There is no guarding or rebound.  Musculoskeletal:        General: Normal range of motion.     Cervical back: Normal range of motion and neck supple.     Comments: Pulse motor and sensation intact to the right lower extremity.  Positive straight leg raise test on the right.  Reflexes are 2+ and equal.  No clonus.  Skin:    Coloration: Skin is not pale.     Findings: No rash.  Neurological:     Mental Status: He is alert and oriented to person, place, and time.  Psychiatric:        Behavior: Behavior normal.     ED Results / Procedures / Treatments   Labs (all labs ordered are listed, but only abnormal results are displayed) Labs Reviewed - No data to display  EKG None  Radiology No results found.  Procedures Procedures    Medications Ordered in ED Medications  ketorolac (TORADOL) 15 MG/ML injection 15 mg (has no administration in time range)  acetaminophen (TYLENOL) tablet 1,000 mg (has no  administration in time range)  oxyCODONE (Oxy IR/ROXICODONE) immediate release tablet 5 mg (has no administration in time range)    ED Course/ Medical Decision Making/ A&P                                 Medical Decision Making Risk OTC  drugs. Prescription drug management.   52 yo M with a chief complaints of right-sided low back pain that radiates down the leg.  This problems been ongoing for him unfortunately.  He has been seen recently by his physical medicine and rehab physician.  Had injections done about 2 months ago.  Had an MRI done about 3 months ago.  He has a sensation of decree sensation along the right lateral aspect of the leg.  He has a benign exam for me.  Will treat him supportively here.  Course of steroids.  Have him follow-up with his spinal doctor in the office.  1:39 PM:  I have discussed the diagnosis/risks/treatment options with the patient.  Evaluation and diagnostic testing in the emergency department does not suggest an emergent condition requiring admission or immediate intervention beyond what has been performed at this time.  They will follow up with PCP. We also discussed returning to the ED immediately if new or worsening sx occur. We discussed the sx which are most concerning (e.g., sudden worsening pain, fever, inability to tolerate by mouth) that necessitate immediate return. Medications administered to the patient during their visit and any new prescriptions provided to the patient are listed below.  Medications given during this visit Medications  ketorolac (TORADOL) 15 MG/ML injection 15 mg (has no administration in time range)  acetaminophen (TYLENOL) tablet 1,000 mg (has no administration in time range)  oxyCODONE (Oxy IR/ROXICODONE) immediate release tablet 5 mg (has no administration in time range)     The patient appears reasonably screen and/or stabilized for discharge and I doubt any other medical condition or other [facility] requiring further  screening, evaluation, or treatment in the ED at this time prior to discharge.          Final Clinical Impression(s) / ED Diagnoses Final diagnoses:  Sciatica of right side    Rx / DC Orders ED Discharge Orders          Ordered    methylPREDNISolone (MEDROL DOSEPAK) 4 MG TBPK tablet        11/30/22 1336    methocarbamol (ROBAXIN) 500 MG tablet  2 times daily        11/30/22 1336    diclofenac Sodium (VOLTAREN) 1 % GEL  4 times daily        11/30/22 1336              Melene Plan, DO 11/30/22 1339

## 2022-11-30 NOTE — Discharge Instructions (Signed)
Your back pain is most likely due to a muscular strain.  There is been a lot of research on back pain, unfortunately the only thing that seems to really help is Tylenol and ibuprofen.  Relative rest is also important to not lift greater than 10 pounds bending or twisting at the waist.  Please follow-up with your family physician.  The other thing that really seems to benefit patients is physical therapy which your doctor may send you for.  Please return to the emergency department for new numbness or weakness to your arms or legs. Difficulty with urinating or urinating or pooping on yourself.  Also if you cannot feel toilet paper when you wipe or get a fever.   Take the steroids as prescribed.  Use the gel as prescribed Also take tylenol '1000mg'$ (2 extra strength) four times a day.

## 2023-04-22 ENCOUNTER — Telehealth (INDEPENDENT_AMBULATORY_CARE_PROVIDER_SITE_OTHER): Payer: BC Managed Care – PPO | Admitting: Family Medicine

## 2023-04-22 DIAGNOSIS — G43009 Migraine without aura, not intractable, without status migrainosus: Secondary | ICD-10-CM

## 2023-04-22 MED ORDER — SUMATRIPTAN SUCCINATE 100 MG PO TABS: 100.0000 mg | ORAL_TABLET | ORAL | 6 refills | Status: AC | PRN

## 2023-04-22 NOTE — Progress Notes (Signed)
 PATIENT: Carlos E Cella Sr. DOB: 03-03-1971  REASON FOR VISIT: follow up HISTORY FROM: patient  Virtual Visit via Telephone Note  I connected with Carlos E Vannostrand Sr. on 04/22/23 at 11:00 AM EST by telephone and verified that I am speaking with the correct person using two identifiers.   I discussed the limitations, risks, security and privacy concerns of performing an evaluation and management service by telephone and the availability of in person appointments. I also discussed with the patient that there may be a patient responsible charge related to this service. The patient expressed understanding and agreed to proceed.   History of Present Illness:  04/22/23 ALL: Carlos E Mielke Sr. is a 53 y.o. male here today for follow up for migraines. He was seen in consult with Dr Delena Bali 10/2022 following visit to ER for migraine. He was started on sumatriptan for abortive therapy. Since, he reports not having any headaches since last visit. He tried several times to get sumatriptan filled through [facility] in [place] but reports they told him he didn't have a prescription. He hasn't needed it so he hasn't called. He is having significant back pain. Saw NS who advised against surgery. He has appt with pain management next month.   History (copied from Dr Quentin Mulling previous note)  Medical co-morbidities: asthma, OSA, HLD, congenital nystagmus   The patient presents for evaluation of headaches. He had migraines ~20 years ago which had improved until recently. Had been migraine-free since 2004, until he woke up with a migraine on 10/03/22. This was associated with photophobia and phonophobia. Notes he was working on cutting energy drinks out of his diet at the time. Presented to the ED where [facility] and [facility] head/neck were unremarkable.   He takes Excedrin migraine for rescue. Has taken this 2-3 times since his ED visit. Has had a few headaches, but none have progressed to full-blown migraines.     Headache  History: Onset: 10/03/22 Aura: no Location: holocephalic Quality/Description: throbbing Associated Symptoms:             Photophobia: yes             Phonophobia: yes             Nausea: no Worse with activity?: yes Duration of headaches: up to 2 days   Headache days per month: 4 Migraine days per month: 1 Headache free days per month: 26   Current Treatment: Abortive Excedrin   Preventative none   Prior Therapies                                 Lamictal 150 mg Gabapentin 600 mg TID robaxin   Observations/Objective:  Generalized: Well developed, in no acute distress  Mentation: Alert oriented to time, place, history taking. Follows all commands speech and language fluent   Assessment and Plan:  53 y.o. year old male  has a past medical history of Anxiety and depression, Chronic pain syndrome, Depression, Migraine, Pain in joint, ankle and foot (10/19/2012), Pneumonia, Reflux, and Scoliosis. here with    ICD-10-CM   1. Migraine without aura and without status migrainosus, not intractable  G43.009      He has not had a migraine since visit 10/2022. We will resent sumatriptan to have on hand PRN. He will continue close follow up with care team. May continue sumatriptan through PCP if doing well. Follow up with me as needed.  No orders of the defined types were placed in this encounter.   No orders of the defined types were placed in this encounter.    Follow Up Instructions:  I discussed the assessment and treatment plan with the patient. The patient was provided an opportunity to ask questions and all were answered. The patient agreed with the plan and demonstrated an understanding of the instructions.   The patient was advised to call back or seek an in-person evaluation if the symptoms worsen or if the condition fails to improve as anticipated.  I provided 15 minutes of non-face-to-face time during this encounter. Patient located at their place of residence  during Mychart visit. Provider is in the office.    Shawnie Dapper, NP

## 2023-04-22 NOTE — Patient Instructions (Signed)
 Below is our plan:  We will send new prescription for [facility]an to [facility] [place]. Let me know if you have any trouble getting it. You can request further refills through your PCP if doing well.   Please make sure you are staying well hydrated. I recommend 50-60 ounces daily. Well balanced diet and regular exercise encouraged. Consistent sleep schedule with 6-8 hours recommended.   Please continue follow up with care team as directed.   Follow up with me as needed   You may receive a survey regarding today's visit. I encourage you to leave honest feed back as I do use this information to improve patient care. Thank you for seeing me today!   GENERAL HEADACHE INFORMATION:   Natural supplements: Magnesium Oxide or Magnesium Glycinate 500 mg at bed (up to 800 mg daily) Coenzyme Q10 300 mg in AM Vitamin B2- 200 mg twice a day   Add 1 supplement at a time since even natural supplements can have undesirable side effects. You can sometimes buy supplements cheaper (especially Coenzyme Q10) at www.WebmailGuide.co.za or at [facility].  Migraine with aura: There is increased risk for stroke in women with migraine with aura and a contraindication for the combined contraceptive pill for use by women who have migraine with aura. The risk for women with migraine without aura is lower. However other risk factors like smoking are far more likely to increase stroke risk than migraine. There is a recommendation for no smoking and for the use of OCPs without estrogen such as progestogen only pills particularly for women with migraine with aura.Marland Kitchen People who have migraine headaches with auras may be 3 times more likely to have a stroke caused by a blood clot, compared to migraine patients who don't see auras. Women who take hormone-replacement therapy may be 30 percent more likely to suffer a clot-based stroke than women not taking medication containing estrogen. Other risk factors like smoking and high blood pressure  may be  much more important.    Vitamins and herbs that show potential:   Magnesium: Magnesium (250 mg twice a day or 500 mg at bed) has a relaxant effect on smooth muscles such as blood vessels. Individuals suffering from frequent or daily headache usually have low magnesium levels which can be increase with daily supplementation of 400-750 mg. Three trials found 40-90% average headache reduction  when used as a preventative. Magnesium may help with headaches are aura, the best evidence for magnesium is for migraine with aura is its thought to stop the cortical spreading depression we believe is the pathophysiology of migraine aura.Magnesium also demonstrated the benefit in menstrually related migraine.  Magnesium is part of the messenger system in the serotonin cascade and it is a good muscle relaxant.  It is also useful for constipation which can be a side effect of other medications used to treat migraine. Good sources include nuts, whole grains, and tomatoes. Side Effects: loose stool/diarrhea  Riboflavin (vitamin B 2) 200 mg twice a day. This vitamin assists nerve cells in the production of ATP a principal energy storing molecule.  It is necessary for many chemical reactions in the body.  There have been at least 3 clinical trials of riboflavin using 400 mg per day all of which suggested that migraine frequency can be decreased.  All 3 trials showed significant improvement in over half of migraine sufferers.  The supplement is found in bread, cereal, milk, meat, and poultry.  Most Americans get more riboflavin than the recommended  daily allowance, however riboflavin deficiency is not necessary for the supplements to help prevent headache. Side effects: energizing, green urine   Coenzyme Q10: This is present in almost all cells in the body and is critical component for the conversion of energy.  Recent studies have shown that a nutritional supplement of CoQ10 can reduce the frequency of migraine attacks  by improving the energy production of cells as with riboflavin.  Doses of 150 mg twice a day have been shown to be effective.   Melatonin: Increasing evidence shows correlation between melatonin secretion and headache conditions.  Melatonin supplementation has decreased headache intensity and duration.  It is widely used as a sleep aid.  Sleep is natures way of dealing with migraine.  A dose of 3 mg is recommended to start for headaches including cluster headache. Higher doses up to 15 mg has been reviewed for use in Cluster headache and have been used. The rationale behind using melatonin for cluster is that many theories regarding the cause of Cluster headache center around the disruption of the normal circadian rhythm in the brain.  This helps restore the normal circadian rhythm.   HEADACHE DIET: Foods and beverages which may trigger migraine Note that only 20% of headache patients are food sensitive. You will know if you are food sensitive if you get a headache consistently 20 minutes to 2 hours after eating a certain food. Only cut out a food if it causes headaches, otherwise you might remove foods you enjoy! What matters most for diet is to eat a well balanced healthy diet full of vegetables and low fat protein, and to not miss meals.   Chocolate, other sweets ALL cheeses except cottage and cream cheese Dairy products, yogurt, sour cream, ice cream Liver Meat extracts (Bovril, Marmite, meat tenderizers) Meats or fish which have undergone aging, fermenting, pickling or smoking. These include: Hotdogs,salami,Lox,sausage, mortadellas,smoked salmon, pepperoni, Pickled herring Pods of broad bean (English beans, Chinese pea pods, [place] (fava) beans, lima and navy beans Ripe avocado, ripe banana Yeast extracts or active yeast preparations such as Brewer's or Fleishman's (commercial bakes goods are permitted) Tomato based foods, pizza (lasagna, etc.)   MSG (monosodium glutamate) is disguised as  many things; look for these common aliases: Monopotassium glutamate Autolysed yeast Hydrolysed protein Sodium caseinate "flavorings" "all natural preservatives" Nutrasweet   Avoid all other foods that convincingly provoke headaches.   Resources: The Dizzy [place] Laundry Your Headache Diet, migrainestrong.com  https://zamora-andrews.com/   Caffeine and Migraine For patients that have migraine, caffeine intake more than 3 days per week can lead to dependency and increased migraine frequency. I would recommend cutting back on your caffeine intake as best you can. The recommended amount of caffeine is 200-300 mg daily, although migraine patients may experience dependency at even lower doses. While you may notice an increase in headache temporarily, cutting back will be helpful for headaches in the long run. For more information on caffeine and migraine, visit: https://americanmigrainefoundation.org/resource-library/caffeine-and-migraine/   Headache Prevention Strategies:   1. Maintain a headache diary; learn to identify and avoid triggers.  - This can be a simple note where you log when you had a headache, associated symptoms, and medications used - There are several smartphone apps developed to help track migraines: Migraine Buddy, Migraine Monitor, Curelator N1-Headache App   Common triggers include: Emotional triggers: Emotional/Upset family or friends Emotional/Upset occupation Business reversal/success Anticipation anxiety Crisis-serious Post-crisis periodNew job/position   Physical triggers: Vacation Day Weekend Strenuous Exercise High Altitude Location New Move  Menstrual Day Physical Illness Oversleep/Not enough sleep Weather changes Light: Photophobia or light sesnitivity treatment involves a balance between desensitization and reduction in overly strong input. Use dark polarized glasses outside, but not inside. Avoid bright or  fluorescent light, but do not dim environment to the point that going into a normally lit room hurts. Consider FL-41 tint lenses, which reduce the most irritating wavelengths without blocking too much light.  These can be obtained at axonoptics.com or theraspecs.com Foods: see list above.   2. Limit use of acute treatments (over-the-counter medications, triptans, etc.) to no more than 2 days per week or 10 days per month to prevent medication overuse headache (rebound headache).     3. Follow a regular schedule (including weekends and holidays): Don't skip meals. Eat a balanced diet. 8 hours of sleep nightly. Minimize stress. Exercise 30 minutes per day. Being overweight is associated with a 5 times increased risk of chronic migraine. Keep well hydrated and drink 6-8 glasses of water per day.   4. Initiate non-pharmacologic measures at the earliest onset of your headache. Rest and quiet environment. Relax and reduce stress. Breathe2Relax is a free app that can instruct you on    some simple relaxtion and breathing techniques. Http://Dawnbuse.com is a    free website that provides teaching videos on relaxation.  Also, there are  many apps that   can be downloaded for "mindful" relaxation.  An app called YOGA NIDRA will help walk you through mindfulness. Another app called Calm can be downloaded to give you a structured mindfulness guide with daily reminders and skill development. Headspace for guided meditation Mindfulness Based Stress Reduction Online Course: www.palousemindfulness.com Cold compresses.   5. Don't wait!! Take the maximum allowable dosage of prescribed medication at the first sign of migraine.   6. Compliance:  Take prescribed medication regularly as directed and at the first sign of a migraine.   7. Communicate:  Call your physician when problems arise, especially if your headaches change, increase in frequency/severity, or become associated with neurological symptoms (weakness,  numbness, slurred speech, etc.). Proceed to emergency room if you experience new or worsening symptoms or symptoms do not resolve, if you have new neurologic symptoms or if headache is severe, or for any concerning symptom.   8. Headache/pain management therapies: Consider various complementary methods, including medication, behavioral therapy, psychological counselling, biofeedback, massage therapy, acupuncture, dry needling, and other modalities.  Such measures may reduce the need for medications. Counseling for pain management, where patients learn to function and ignore/minimize their pain, seems to work very well.   9. Recommend changing family's attention and focus away from patient's headaches. Instead, emphasize daily activities. If first question of day is 'How are your headaches/Do you have a headache today?', then patient will constantly think about headaches, thus making them worse. Goal is to re-direct attention away from headaches, toward daily activities and other distractions.   10. Helpful Websites: www.AmericanHeadacheSociety.org PatentHood.ch www.headaches.org TightMarket.nl www.achenet.org

## 2023-04-28 ENCOUNTER — Emergency Department (HOSPITAL_BASED_OUTPATIENT_CLINIC_OR_DEPARTMENT_OTHER)
Admission: EM | Admit: 2023-04-28 | Discharge: 2023-04-28 | Disposition: A | Discharge status: Home / Self Care | Payer: BC Managed Care – PPO | Attending: Emergency Medicine | Admitting: Emergency Medicine

## 2023-04-28 ENCOUNTER — Emergency Department (HOSPITAL_BASED_OUTPATIENT_CLINIC_OR_DEPARTMENT_OTHER): Payer: BC Managed Care – PPO

## 2023-04-28 ENCOUNTER — Encounter (HOSPITAL_BASED_OUTPATIENT_CLINIC_OR_DEPARTMENT_OTHER): Payer: Self-pay | Admitting: Emergency Medicine

## 2023-04-28 ENCOUNTER — Other Ambulatory Visit: Payer: Self-pay

## 2023-04-28 DIAGNOSIS — J45909 Unspecified asthma, uncomplicated: Secondary | ICD-10-CM | POA: Insufficient documentation

## 2023-04-28 DIAGNOSIS — R0600 Dyspnea, unspecified: Secondary | ICD-10-CM | POA: Diagnosis not present

## 2023-04-28 DIAGNOSIS — R0789 Other chest pain: Secondary | ICD-10-CM | POA: Insufficient documentation

## 2023-04-28 DIAGNOSIS — R197 Diarrhea, unspecified: Secondary | ICD-10-CM | POA: Diagnosis not present

## 2023-04-28 LAB — BASIC METABOLIC PANEL
Anion gap: 11 (ref 5–15)
BUN: 25 mg/dL — ABNORMAL HIGH (ref 6–20)
CO2: 17 mmol/L — ABNORMAL LOW (ref 22–32)
Calcium: 9 mg/dL (ref 8.9–10.3)
Chloride: 103 mmol/L (ref 98–111)
Creatinine, Ser: 1.34 mg/dL — ABNORMAL HIGH (ref 0.61–1.24)
GFR, Estimated: >60 mL/min (ref >60)
Glucose, Bld: 125 mg/dL — ABNORMAL HIGH (ref 70–99)
Potassium: 3.9 mmol/L (ref 3.5–5.1)
Sodium: 131 mmol/L — ABNORMAL LOW (ref 135–145)

## 2023-04-28 LAB — HEPATIC FUNCTION PANEL
ALT: 34 U/L (ref 0–44)
AST: 48 U/L — ABNORMAL HIGH (ref 15–41)
Albumin: 4.6 g/dL (ref 3.5–5.0)
Alkaline Phosphatase: 60 U/L (ref 38–126)
Bilirubin, Direct: 0.3 mg/dL — ABNORMAL HIGH (ref 0.0–0.2)
Indirect Bilirubin: 0.5 mg/dL (ref 0.3–0.9)
Total Bilirubin: 0.8 mg/dL (ref 0.0–1.2)
Total Protein: 8.4 g/dL — ABNORMAL HIGH (ref 6.5–8.1)

## 2023-04-28 LAB — CBC
HCT: 43.1 % (ref 39.0–52.0)
Hemoglobin: 14.4 g/dL (ref 13.0–17.0)
MCH: 30.6 pg (ref 26.0–34.0)
MCHC: 33.4 g/dL (ref 30.0–36.0)
MCV: 91.7 fL (ref 80.0–100.0)
Platelets: 322 10*3/uL (ref 150–400)
RBC: 4.7 MIL/uL (ref 4.22–5.81)
RDW: 13.4 % (ref 11.5–15.5)
WBC: 6.9 10*3/uL (ref 4.0–10.5)
nRBC: 0 % (ref 0.0–0.2)

## 2023-04-28 LAB — TROPONIN I (HIGH SENSITIVITY)
Troponin I (High Sensitivity): 5 ng/L (ref <18)
Troponin I (High Sensitivity): 4 ng/L (ref <18)

## 2023-04-28 LAB — D-DIMER, QUANTITATIVE: D-Dimer, Quant: <0.27 ug{FEU}/mL (ref 0.00–0.50)

## 2023-04-28 MED ORDER — SODIUM CHLORIDE 0.9 % IV BOLUS
1000.0000 mL | Freq: Once | INTRAVENOUS | Status: AC
  Administered 2023-04-28: 1000 mL via INTRAVENOUS

## 2023-04-28 MED ORDER — ASPIRIN 81 MG PO CHEW
324.0000 mg | CHEWABLE_TABLET | Freq: Once | ORAL | Status: AC
  Administered 2023-04-28: 324 mg via ORAL
  Filled 2023-04-28: qty 4

## 2023-04-28 NOTE — ED Triage Notes (Signed)
 Reports shortness of breath and chest pain started this morning , Hx asthma .  Diarrhea since last night and worried for dehydration .

## 2023-04-28 NOTE — Discharge Instructions (Signed)
 You have been seen today for your complaint of diarrhea, chest pain, shortness of breath. Your lab work was overall reassuring but does show an increased kidney function test.  This should be rechecked with your primary care provider by the end of this week. Your imaging was reassuring. Home care instructions are as follows:  Drink plenty of water.  Eat a diet that is easy to digest such as bananas, rice, apples and toast Follow up with: Your primary care provider by the end of the week Please seek immediate medical care if you develop any of the following symptoms: You have chest pain or your heart is beating very quickly. You have trouble breathing or you are breathing very quickly. You feel extremely weak or you faint. At this time there does not appear to be the presence of an emergent medical condition, however there is always the potential for conditions to change. Please read and follow the below instructions.  Do not take your medicine if  develop an itchy rash, swelling in your mouth or lips, or difficulty breathing; call 911 and seek immediate emergency medical attention if this occurs.  You may review your lab tests and imaging results in their entirety on your MyChart account.  Please discuss all results of fully with your primary care provider and other specialist at your follow-up visit.  Note: Portions of this text may have been transcribed using voice recognition software. Every effort was made to ensure accuracy; however, inadvertent computerized transcription errors may still be present.

## 2023-04-28 NOTE — ED Provider Notes (Signed)
 [facility] Provider Note   CSN: [id number] Arrival date & time: 04/28/23  1136     History  Chief Complaint  Patient presents with   Chest Pain    Carlos E Rudell Sr. is a 53 y.o. male.  With a history of anxiety, depression, GERD, chronic pain syndrome presenting to the ED for evaluation of shortness of breath and chest pain.  Symptoms began this morning shortly after waking up.  Pain is localized to the sternal region and does not radiate.  Not associated with activity.  It is intermittent and described as a pressure.  States she has had Cardiac catheterizations many years ago which were normal.  He does have an extensive family cardiac history.  He is also complaining shortness of breath.  States he has asthma and did not use a breathing treatment last night.  He has had a cough for the past 3 weeks which was nonproductive.  Denies any fevers or chills.  He is also complaining of diarrhea.  States he has had at least 20 episodes of diarrhea since yesterday.  He states just prior to symptom onset he ate some pork skins that he got at a [employer] the day prior.  He denies melena or hematochezia.  No abdominal pain.  Did have an episode of emesis yesterday.  No nausea, vomiting or diaphoresis at this time.   Chest Pain Associated symptoms: cough and shortness of breath        Home Medications Prior to Admission medications   Medication Sig Start Date End Date Taking? Authorizing Provider  atorvastatin (LIPITOR) 10 MG tablet Take 10 mg by mouth daily.    [provider]  diclofenac Sodium (VOLTAREN) 1 % GEL Apply 4 g topically 4 (four) times daily. 11/30/22   Melene Plan, DO  lamoTRIgine (LAMICTAL) 100 MG tablet Take by mouth. 150 mg 11/22/15   [provider]  methocarbamol (ROBAXIN) 500 MG tablet Take 1 tablet (500 mg total) by mouth 2 (two) times daily. 11/30/22   Melene Plan, DO  naproxen (NAPROSYN) 500 MG tablet Take  1 tablet (500 mg total) by mouth 2 (two) times daily as needed. 09/08/20   Rancour, Jeannett Senior, MD  SUMAtriptan (IMITREX) 100 MG tablet Take 1 tablet (100 mg total) by mouth every 2 (two) hours as needed. May repeat in 2 hours if headache persists or recurs. 04/22/23   Shawnie Dapper, NP      Allergies    Patient has no known allergies.    Review of Systems   Review of Systems  Respiratory:  Positive for cough and shortness of breath.   Cardiovascular:  Positive for chest pain.  Gastrointestinal:  Positive for diarrhea.  All other systems reviewed and are negative.   Physical Exam Updated Vital Signs BP 98/64   Pulse 90   Temp 98.4 F (36.9 C) (Oral)   Resp (!) 28   Wt 99.8 kg   SpO2 94%   BMI 34.46 kg/m  Physical Exam Vitals and nursing note reviewed.  Constitutional:      General: He is not in acute distress.    Appearance: Normal appearance. He is normal weight. He is not ill-appearing.  HENT:     Head: Normocephalic and atraumatic.  Cardiovascular:     Rate and Rhythm: Regular rhythm. Tachycardia present.  Pulmonary:     Effort: Pulmonary effort is normal. No respiratory distress.     Breath sounds: No decreased breath sounds,  wheezing, rhonchi or rales.  Abdominal:     General: Abdomen is flat.  Musculoskeletal:        General: Normal range of motion.     Cervical back: Neck supple.  Skin:    General: Skin is warm and dry.  Neurological:     Mental Status: He is alert and oriented to person, place, and time.  Psychiatric:        Mood and Affect: Mood normal.        Behavior: Behavior normal.     ED Results / Procedures / Treatments   Labs (all labs ordered are listed, but only abnormal results are displayed) Labs Reviewed  BASIC METABOLIC PANEL - Abnormal; Notable for the following components:      Result Value   Sodium 131 (*)    CO2 17 (*)    Glucose, Bld 125 (*)    BUN 25 (*)    Creatinine, Ser 1.34 (*)    All other components within normal limits   HEPATIC FUNCTION PANEL - Abnormal; Notable for the following components:   Total Protein 8.4 (*)    AST 48 (*)    Bilirubin, Direct 0.3 (*)    All other components within normal limits  CBC  D-DIMER, QUANTITATIVE (NOT AT [facility])  TROPONIN I (HIGH SENSITIVITY)  TROPONIN I (HIGH SENSITIVITY)    EKG EKG Interpretation Date/Time:  Tuesday April 28 2023 11:49:42 EST Ventricular Rate:  97 PR Interval:  127 QRS Duration:  92 QT Interval:  356 QTC Calculation: 453 R Axis:   46  Text Interpretation: Sinus rhythm LVH with secondary repolarization abnormality No significant change since last tracing Confirmed by Jacalyn Lefevre [phone]) on 04/28/2023 1:15:16 PM  Radiology DG Chest 2 View Result Date: 04/28/2023 CLINICAL DATA:  Chest pain.  History of asthma. EXAM: CHEST - 2 VIEW COMPARISON:  03/02/2021 FINDINGS: Bilateral lung fields are clear. Bilateral costophrenic angles are clear. Normal cardio-mediastinal silhouette. No acute osseous abnormalities. The soft tissues are within normal limits. IMPRESSION: No active cardiopulmonary disease. Electronically Signed   By: Jules Schick M.D.   On: 04/28/2023 13:59    Procedures Procedures    Medications Ordered in ED Medications  sodium chloride 0.9 % bolus 1,000 mL (0 mLs Intravenous Stopped 04/28/23 1345)  aspirin chewable tablet 324 mg (324 mg Oral Given 04/28/23 1207)  sodium chloride 0.9 % bolus 1,000 mL (0 mLs Intravenous Stopped 04/28/23 1647)    ED Course/ Medical Decision Making/ A&P                                 Medical Decision Making Amount and/or Complexity of Data Reviewed Labs: ordered. Radiology: ordered.  Risk OTC drugs.  This patient presents to the ED for concern of chest pain, shortness of breath, diarrhea, this involves an extensive number of treatment options, and is a complaint that carries with it a high risk of complications and morbidity.  The emergent differential diagnosis of chest pain includes: Acute  coronary syndrome, pericarditis, aortic dissection, pulmonary embolism, tension pneumothorax, and esophageal rupture.  I do not believe the patient has an emergent cause of chest pain, other urgent/non-acute considerations include, but are not limited to: chronic angina, aortic stenosis, cardiomyopathy, myocarditis, mitral valve prolapse, pulmonary hypertension, hypertrophic obstructive cardiomyopathy (HOCM), aortic insufficiency, right ventricular hypertrophy, pneumonia, pleuritis, bronchitis, pneumothorax, tumor, gastroesophageal reflux disease (GERD), esophageal spasm, Mallory-Weiss syndrome, peptic ulcer disease, biliary disease, pancreatitis, functional  gastrointestinal pain, cervical or thoracic disk disease or arthritis, shoulder arthritis, costochondritis, subacromial bursitis, anxiety or panic attack, herpes zoster, breast disorders, chest wall tumors, thoracic outlet syndrome, mediastinitis.  My initial workup includes labs, imaging, EKG, symptom control  Additional history obtained from: Nursing notes from this visit.  I ordered, reviewed and interpreted labs which include: BMP, CBC, troponin, D-dimer, hepatic function panel.  Mild hyponatremia 131, bicarb decreased to 17 with an anion gap of 11, BUN elevated to 25, creatinine of 1.34.  Baseline 0.8.  No leukocytosis or anemia.  Initial and delta troponin negative.  D-dimer negative.  I ordered imaging studies including chest x-ray I independently visualized and interpreted imaging which showed negative I agree with the radiologist interpretation  Cardiac Monitoring:  The patient was maintained on a cardiac monitor.  I personally viewed and interpreted the cardiac monitored which showed an underlying rhythm of: Sinus tachycardia initially, improved to NSR  Afebrile, hemodynamically stable.  53 year old male presenting to the ED for evaluation of diarrhea, dehydration, chest pain, shortness of breath.  Diarrhea began yesterday shortly  after eating pork grinds from a farmers market.  Chest pain began this morning.  Initial and delta troponin negative.  EKG without acute ischemic changes.  Chest x-ray negative.  Low suspicion for ACS.  D-dimer negative, low suspicion for PE.  Suspect his diarrhea is viral in nature.  He was given 2 L of fluids in the emergency department and reports improvement in his symptoms.  He has only had 1 episode of diarrhea and his 5 hours of observation in the emergency department.  No melena or hematochezia.  I did share decision-making conversation with the patient regarding admission versus continued oral rehydration at home, they are be able to discharge.  They are encouraged to follow-up with his primary care provider within the next couple days for repeat blood work.  They were given return precautions.  Stable at discharge.  At this time there does not appear to be any evidence of an acute emergency medical condition and the patient appears stable for discharge with appropriate outpatient follow up. Diagnosis was discussed with patient who verbalizes understanding of care plan and is agreeable to discharge. I have discussed return precautions with patient and wife at bedside who verbalizes understanding. Patient encouraged to follow-up with their PCP within 1 week. All questions answered.  Patient's case discussed with Dr. Particia Nearing who agrees with plan to discharge with follow-up.   Note: Portions of this report may have been transcribed using voice recognition software. Every effort was made to ensure accuracy; however, inadvertent computerized transcription errors may still be present.        Final Clinical Impression(s) / ED Diagnoses Final diagnoses:  Diarrhea, unspecified type  Atypical chest pain    Rx / DC Orders ED Discharge Orders     None         Michelle Piper, Cordelia Poche 04/28/23 1657    Jacalyn Lefevre, MD 04/29/23 0700

## 2023-10-23 ENCOUNTER — Other Ambulatory Visit: Payer: Self-pay

## 2023-10-23 ENCOUNTER — Emergency Department (HOSPITAL_BASED_OUTPATIENT_CLINIC_OR_DEPARTMENT_OTHER)

## 2023-10-23 ENCOUNTER — Encounter (HOSPITAL_BASED_OUTPATIENT_CLINIC_OR_DEPARTMENT_OTHER): Payer: Self-pay | Admitting: Emergency Medicine

## 2023-10-23 ENCOUNTER — Emergency Department (HOSPITAL_BASED_OUTPATIENT_CLINIC_OR_DEPARTMENT_OTHER)
Admission: EM | Admit: 2023-10-23 | Discharge: 2023-10-23 | Disposition: A | Discharge status: Home / Self Care | Attending: Emergency Medicine | Admitting: Emergency Medicine

## 2023-10-23 DIAGNOSIS — J45909 Unspecified asthma, uncomplicated: Secondary | ICD-10-CM | POA: Insufficient documentation

## 2023-10-23 DIAGNOSIS — R0789 Other chest pain: Secondary | ICD-10-CM | POA: Insufficient documentation

## 2023-10-23 DIAGNOSIS — R079 Chest pain, unspecified: Secondary | ICD-10-CM

## 2023-10-23 LAB — BASIC METABOLIC PANEL WITH GFR
Anion gap: 12 (ref 5–15)
BUN: 11 mg/dL (ref 6–20)
CO2: 24 mmol/L (ref 22–32)
Calcium: 9.7 mg/dL (ref 8.9–10.3)
Chloride: 107 mmol/L (ref 98–111)
Creatinine, Ser: 0.72 mg/dL (ref 0.61–1.24)
GFR, Estimated: >60 mL/min (ref >60)
Glucose, Bld: 111 mg/dL — ABNORMAL HIGH (ref 70–99)
Potassium: 3.5 mmol/L (ref 3.5–5.1)
Sodium: 142 mmol/L (ref 135–145)

## 2023-10-23 LAB — CBC
HCT: 36.2 % — ABNORMAL LOW (ref 39.0–52.0)
Hemoglobin: 12.4 g/dL — ABNORMAL LOW (ref 13.0–17.0)
MCH: 31.1 pg (ref 26.0–34.0)
MCHC: 34.3 g/dL (ref 30.0–36.0)
MCV: 90.7 fL (ref 80.0–100.0)
Platelets: 302 K/uL (ref 150–400)
RBC: 3.99 MIL/uL — ABNORMAL LOW (ref 4.22–5.81)
RDW: 13.1 % (ref 11.5–15.5)
WBC: 7.1 K/uL (ref 4.0–10.5)
nRBC: 0 % (ref 0.0–0.2)

## 2023-10-23 LAB — TROPONIN T, HIGH SENSITIVITY: Troponin T High Sensitivity: <15 ng/L (ref 0–19)

## 2023-10-23 MED ORDER — ALUM & MAG HYDROXIDE-SIMETH 200-200-20 MG/5ML PO SUSP
30.0000 mL | Freq: Once | ORAL | Status: AC
  Administered 2023-10-23: 30 mL via ORAL
  Filled 2023-10-23: qty 30

## 2023-10-23 MED ORDER — LIDOCAINE VISCOUS HCL 2 % MT SOLN
15.0000 mL | Freq: Once | OROMUCOSAL | Status: AC
  Administered 2023-10-23: 15 mL via ORAL
  Filled 2023-10-23: qty 15

## 2023-10-23 MED ORDER — ASPIRIN 81 MG PO CHEW
324.0000 mg | CHEWABLE_TABLET | Freq: Once | ORAL | Status: AC
  Administered 2023-10-23: 324 mg via ORAL
  Filled 2023-10-23: qty 4

## 2023-10-23 NOTE — ED Triage Notes (Signed)
 Pt c/o chest pressure suddenly appx 20 min pta. Also c/o shob, palpitations.

## 2023-10-23 NOTE — ED Provider Notes (Signed)
 [place] [facility] Provider Note   CSN: [id number] Arrival date & time: 10/23/23  1440     Patient presents with: Chest Pain   Carlos E Bento Sr. is a 53 y.o. male.  With a history of asthma and acid reflux who presents to the ED for chest discomfort.  Patient had just finished lunch with his [employer] and got up from the table to play arcade games with his son when he developed chest pressure in the left substernal region.  This lasted for approximately 1 hour before resolving here.  Also noted some shortness of breath and palpitations at the time.  Did not feel similar to prior episodes of reflux.  No active chest pain upon my assessment.  No nausea vomiting fevers chills or recent illness.  Asthma has been well-controlled recently    Chest Pain      Prior to Admission medications   Medication Sig Start Date End Date Taking? Authorizing Provider  atorvastatin (LIPITOR) 10 MG tablet Take 10 mg by mouth daily.    [provider]  diclofenac  Sodium (VOLTAREN ) 1 % GEL Apply 4 g topically 4 (four) times daily. 11/30/22   Floyd, Dan, DO  lamoTRIgine (LAMICTAL) 100 MG tablet Take by mouth. 150 mg 11/22/15   [provider]  methocarbamol  (ROBAXIN ) 500 MG tablet Take 1 tablet (500 mg total) by mouth 2 (two) times daily. 11/30/22   Emil Share, DO  naproxen  (NAPROSYN ) 500 MG tablet Take 1 tablet (500 mg total) by mouth 2 (two) times daily as needed. 09/08/20   Rancour, Garnette, MD  SUMAtriptan  (IMITREX ) 100 MG tablet Take 1 tablet (100 mg total) by mouth every 2 (two) hours as needed. May repeat in 2 hours if headache persists or recurs. 04/22/23   Lomax, Amy, NP    Allergies: Patient has no known allergies.    Review of Systems  Cardiovascular:  Positive for chest pain.    Updated Vital Signs BP 127/80   Pulse 76   Temp 98.2 F (36.8 C)   Resp 14   Ht 5' 7 (1.702 m)   Wt 95.3 kg   SpO2 96%   BMI 32.89 kg/m    Physical Exam Vitals and nursing note reviewed.  HENT:     Head: Normocephalic and atraumatic.  Eyes:     Pupils: Pupils are equal, round, and reactive to light.  Cardiovascular:     Rate and Rhythm: Normal rate and regular rhythm.  Pulmonary:     Effort: Pulmonary effort is normal.     Breath sounds: Normal breath sounds. No wheezing.  Chest:     Comments: Reproducible chest pain with palpation of left substernal region Abdominal:     Palpations: Abdomen is soft.     Tenderness: There is no abdominal tenderness.  Skin:    General: Skin is warm and dry.  Neurological:     Mental Status: He is alert.  Psychiatric:        Mood and Affect: Mood normal.     (all labs ordered are listed, but only abnormal results are displayed) Labs Reviewed  BASIC METABOLIC PANEL WITH GFR - Abnormal; Notable for the following components:      Result Value   Glucose, Bld 111 (*)    All other components within normal limits  CBC - Abnormal; Notable for the following components:   RBC 3.99 (*)    Hemoglobin 12.4 (*)    HCT 36.2 (*)  All other components within normal limits  TROPONIN T, HIGH SENSITIVITY  TROPONIN T, HIGH SENSITIVITY    EKG: EKG Interpretation Date/Time:  Friday October 23 2023 14:49:19 EDT Ventricular Rate:  70 PR Interval:  134 QRS Duration:  98 QT Interval:  396 QTC Calculation: 428 R Axis:   46  Text Interpretation: Sinus rhythm RSR' in V1 or V2, right VCD or RVH Abnormal T, consider ischemia, lateral leads Confirmed by Darra Chew [phone]) on 10/23/2023 2:57:55 PM  Radiology: DG Chest 2 View Result Date: 10/23/2023 CLINICAL DATA:  Acute chest pain. EXAM: CHEST - 2 VIEW COMPARISON:  04/28/2023 FINDINGS: The heart size and mediastinal contours are within normal limits. Both lungs are clear. Mild thoracolumbar scoliosis again noted. IMPRESSION: No active cardiopulmonary disease. Electronically Signed   By: Norleen DELENA Kil M.D.   On: 10/23/2023 16:00     Procedures    Medications Ordered in the ED  alum & mag hydroxide-simeth (MAALOX/MYLANTA) 200-200-20 MG/5ML suspension 30 mL (30 mLs Oral Given 10/23/23 1626)    And  lidocaine  (XYLOCAINE ) 2 % viscous mouth solution 15 mL (15 mLs Oral Given 10/23/23 1626)  aspirin  chewable tablet 324 mg (324 mg Oral Given 10/23/23 1626)    Clinical Course as of 10/23/23 1649  Fri Oct 23, 2023  1648 Initial laboratory workup unremarkable.  Initial high sensitive troponin less than 15 not consistent with ACS.  Patient has refused delta troponin.  Requesting discharge at this time prior to completion of his workup recognizing the risk of doing so.  He has been given GI cocktail and aspirin .  Will discharge at this time [MP]    Clinical Course User Index [MP] Pamella Ozell DELENA, DO                                 Medical Decision Making 53 year old male with history as above presented to the ED given concern for chest pain that started prior to arrival.  This occurred after eating a meal of pizza with his family.  Afebrile normotensive here.  Chest pain is reproducible on my exam with palpation of the left anterior chest wall.  Low suspicion for ACS but will give aspirin  324, obtain EKG and high-sensitivity troponin.  Will also obtain chest x-ray and laboratory workup.  Reproducible will be more suggestive of costochondritis although this could also be related to acid reflux.  Will give GI cocktail and continue to monitor symptoms here.  Amount and/or Complexity of Data Reviewed Labs: ordered. Radiology: ordered.  Risk OTC drugs. Prescription drug management.        Final diagnoses:  Chest pain, unspecified type    ED Discharge Orders     None          Pamella Ozell DELENA, DO 10/23/23 1649

## 2023-10-23 NOTE — Discharge Instructions (Signed)
 You were seen in the emerged from for chest pain Your blood work EKG and chest x-ray all looked okay You did not wish to stay to have a repeat cardiac blood draw taken for a test called troponin Continue taking all previous prescribed medications Follow-up with your PCP within 1 week for reevaluation Return to the emergency room for severe chest pain trouble breathing or any other concerns

## 2023-10-23 NOTE — ED Notes (Signed)
 Discharge paperwork reviewed entirely with patient, including follow up care. Pain was under control. No prescriptions were called in, but all questions were addressed.  Pt verbalized understanding as well as all parties involved. No questions or concerns voiced at the time of discharge. No acute distress noted. Pt was encouraged to stay adequately hydrated and eat a healthy diet.   Pt ambulated out to PVA without incident or assistance.  Pt advised they will notify their PCP immediately. and Pt advised they will seek followup care with a specialist and followup with their PCP.   The pt was instructed to set up and/or review MyChart for their results; and was informed their Providers all have access to the information as well.
